# Patient Record
Sex: Female | Born: 2000 | Race: White | Hispanic: Yes | Marital: Single | Smoking: Never smoker
Health system: Southern US, Community
[De-identification: ages and names within clinical notes are randomized; demographics above are authoritative.]

## PROBLEM LIST (undated history)

## (undated) DIAGNOSIS — D649 Anemia, unspecified: Secondary | ICD-10-CM

## (undated) DIAGNOSIS — Z789 Other specified health status: Secondary | ICD-10-CM

## (undated) DIAGNOSIS — G43909 Migraine, unspecified, not intractable, without status migrainosus: Secondary | ICD-10-CM

## (undated) DIAGNOSIS — I889 Nonspecific lymphadenitis, unspecified: Secondary | ICD-10-CM

## (undated) HISTORY — DX: Migraine, unspecified, not intractable, without status migrainosus: G43.909

## (undated) HISTORY — DX: Anemia, unspecified: D64.9

## (undated) HISTORY — PX: NO PAST SURGERIES: SHX2092

---
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
## 2021-01-23 ENCOUNTER — Inpatient Hospital Stay (HOSPITAL_COMMUNITY)
Admission: AD | Admit: 2021-01-23 | Discharge: 2021-01-23 | Disposition: A | Discharge status: Home / Self Care | Payer: Medicaid Other | Attending: Obstetrics and Gynecology | Admitting: Obstetrics and Gynecology

## 2021-01-23 ENCOUNTER — Encounter (HOSPITAL_COMMUNITY): Payer: Self-pay | Admitting: Obstetrics and Gynecology

## 2021-01-23 ENCOUNTER — Inpatient Hospital Stay (HOSPITAL_COMMUNITY): Payer: Medicaid Other

## 2021-01-23 DIAGNOSIS — Z3A01 Less than 8 weeks gestation of pregnancy: Secondary | ICD-10-CM

## 2021-01-23 DIAGNOSIS — O26891 Other specified pregnancy related conditions, first trimester: Secondary | ICD-10-CM | POA: Diagnosis not present

## 2021-01-23 DIAGNOSIS — R109 Unspecified abdominal pain: Secondary | ICD-10-CM | POA: Diagnosis not present

## 2021-01-23 DIAGNOSIS — R1032 Left lower quadrant pain: Secondary | ICD-10-CM | POA: Insufficient documentation

## 2021-01-23 DIAGNOSIS — Z3490 Encounter for supervision of normal pregnancy, unspecified, unspecified trimester: Secondary | ICD-10-CM

## 2021-01-23 DIAGNOSIS — O99891 Other specified diseases and conditions complicating pregnancy: Secondary | ICD-10-CM

## 2021-01-23 HISTORY — DX: Other specified health status: Z78.9

## 2021-01-23 LAB — COMPREHENSIVE METABOLIC PANEL
ALT: 12 U/L (ref 0–44)
AST: 13 U/L — ABNORMAL LOW (ref 15–41)
Albumin: 3.6 g/dL (ref 3.5–5.0)
Alkaline Phosphatase: 47 U/L (ref 38–126)
Anion gap: 8 (ref 5–15)
BUN: 16 mg/dL (ref 6–20)
CO2: 21 mmol/L — ABNORMAL LOW (ref 22–32)
Calcium: 9 mg/dL (ref 8.9–10.3)
Chloride: 106 mmol/L (ref 98–111)
Creatinine, Ser: 0.72 mg/dL (ref 0.44–1.00)
GFR, Estimated: >60 mL/min (ref >60)
Glucose, Bld: 87 mg/dL (ref 70–99)
Potassium: 4 mmol/L (ref 3.5–5.1)
Sodium: 135 mmol/L (ref 135–145)
Total Bilirubin: 0.5 mg/dL (ref 0.3–1.2)
Total Protein: 6.6 g/dL (ref 6.5–8.1)

## 2021-01-23 LAB — CBC
HCT: 34.1 % — ABNORMAL LOW (ref 36.0–46.0)
Hemoglobin: 11.5 g/dL — ABNORMAL LOW (ref 12.0–15.0)
MCH: 27.5 pg (ref 26.0–34.0)
MCHC: 33.7 g/dL (ref 30.0–36.0)
MCV: 81.6 fL (ref 80.0–100.0)
Platelets: 222 10*3/uL (ref 150–400)
RBC: 4.18 MIL/uL (ref 3.87–5.11)
RDW: 13.7 % (ref 11.5–15.5)
WBC: 6.1 10*3/uL (ref 4.0–10.5)
nRBC: 0 % (ref 0.0–0.2)

## 2021-01-23 LAB — URINALYSIS, ROUTINE W REFLEX MICROSCOPIC
Bilirubin Urine: NEGATIVE
Glucose, UA: NEGATIVE mg/dL
Hgb urine dipstick: NEGATIVE
Ketones, ur: NEGATIVE mg/dL
Leukocytes,Ua: NEGATIVE
Nitrite: NEGATIVE
Protein, ur: NEGATIVE mg/dL
Specific Gravity, Urine: 1.02 (ref 1.005–1.030)
pH: 6 (ref 5.0–8.0)

## 2021-01-23 LAB — WET PREP, GENITAL
Clue Cells Wet Prep HPF POC: NONE SEEN
Trich, Wet Prep: NONE SEEN
Yeast Wet Prep HPF POC: NONE SEEN

## 2021-01-23 LAB — POCT PREGNANCY, URINE: Preg Test, Ur: POSITIVE — AB

## 2021-01-23 LAB — ABO/RH: ABO/RH(D): O POS

## 2021-01-23 LAB — HCG, QUANTITATIVE, PREGNANCY: hCG, Beta Chain, Quant, S: 85267 m[IU]/mL — ABNORMAL HIGH (ref <5)

## 2021-01-23 MED ORDER — ONDANSETRON 8 MG PO TBDP: 8.0000 mg | ORAL_TABLET | Freq: Three times a day (TID) | ORAL | 1 refills | Status: DC | PRN

## 2021-01-23 MED ORDER — FERROUS SULFATE 325 (65 FE) MG PO TABS: 325.0000 mg | ORAL_TABLET | ORAL | 1 refills | Status: DC

## 2021-01-23 NOTE — MAU Provider Note (Signed)
Patient Theresa Lane is a 20 y.o. G1P0  At [redacted]w[redacted]d here with complaints of abdominal pain that is ongoing since Sunday (two days ago).  She also reports that she had some bleeding on Sunday as well (two days ago). She reports some nausea, threw up this morning. She denies diarrhea, constipation, fever, chest pain, SOB. She has been told before that she is anemic.  She denies dysuria, abnormal discharge. She had intercourse this morning.  History     CSN: [id number]  Arrival date and time: 01/23/21 1308   None     Chief Complaint  Patient presents with   Abdominal Pain   Abdominal Pain This is a new problem. The current episode started in the past 7 days. The pain is located in the LLQ and suprapubic region. The pain is at a severity of 6/10. The quality of the pain is described as sharp and aching. The pain does not radiate. Associated symptoms include constipation, nausea and vomiting. Pertinent negatives include no diarrhea, dysuria or fever. Nothing relieves the symptoms.  Vaginal Bleeding The patient's primary symptoms include vaginal bleeding. The patient's pertinent negatives include no vaginal discharge. The current episode started in the past 7 days. The problem has been resolved. Associated symptoms include abdominal pain, constipation, nausea and vomiting. Pertinent negatives include no diarrhea, dysuria or fever. The vaginal discharge was bloody. The vaginal bleeding is spotting. Passing clots: small clots.   OB History     Gravida  1   Para      Term      Preterm      AB      Living         SAB      IAB      Ectopic      Multiple      Live Births              Past Medical History:  Diagnosis Date   Medical history non-contributory     Past Surgical History:  Procedure Laterality Date   NO PAST SURGERIES      Family History  Problem Relation Age of Onset   Asthma Mother    Healthy Father     Social History   Tobacco Use   Smoking  status: Never   Smokeless tobacco: Never  Substance Use Topics   Alcohol use: Never   Drug use: Never    Allergies: No Known Allergies  Medications Prior to Admission  Medication Sig Dispense Refill Last Dose   Prenatal Vit-Fe Fumarate-FA (PRENATAL MULTIVITAMIN) TABS tablet Take 1 tablet by mouth daily at 12 noon.   01/23/2021   acetaminophen (TYLENOL) 500 MG tablet Take 500 mg by mouth daily as needed for mild pain or fever.      cyclobenzaprine (FLEXERIL) 5 MG tablet Take 1 tablet (5 mg total) by mouth 2 (two) times daily as needed for muscle spasms. 20 tablet 0    ibuprofen (ADVIL,MOTRIN) 400 MG tablet Take 1 tablet (400 mg total) by mouth every 6 (six) hours as needed. (Patient not taking: Reported on 02/16/2018) 30 tablet 0    meloxicam (MOBIC) 7.5 MG tablet Take 1 tablet (7.5 mg total) by mouth daily. 15 tablet 0     Review of Systems  Constitutional:  Negative for fever.  HENT: Negative.    Respiratory: Negative.    Gastrointestinal:  Positive for abdominal pain, constipation, nausea and vomiting. Negative for diarrhea.  Genitourinary:  Positive for vaginal bleeding. Negative for dysuria  and vaginal discharge.  Skin: Negative.   Neurological: Negative.   Psychiatric/Behavioral: Negative.    Physical Exam   Blood pressure 119/60, pulse 79, temperature 98.6 F (37 C), temperature source Oral, resp. rate 15, height 4\' 10"  (1.473 m), weight 50.3 kg, last menstrual period 12/04/2020, SpO2 99 %.  Physical Exam Constitutional:      Appearance: She is well-developed.  HENT:     Head: Normocephalic.  Cardiovascular:     Rate and Rhythm: Normal rate.  Abdominal:     General: Abdomen is flat. Bowel sounds are normal.     Palpations: Abdomen is soft.     Tenderness: There is abdominal tenderness in the left lower quadrant.  Genitourinary:    Vagina: Normal.     Cervix: Normal.     Uterus: Normal.      Adnexa:        Right: No mass or tenderness.         Left: No mass or  tenderness.       Comments: NEFG; no blood or discharge in the vagina, cervix is pink, no lesions on cervix or vaginal walls; no CMT, cervix is long, closed thick Skin:    General: Skin is warm.  Neurological:     General: No focal deficit present.     Mental Status: She is alert.  Psychiatric:        Mood and Affect: Mood normal.        Behavior: Behavior normal.    MAU Course  Procedures  MDM -due to patient's complaint, a full ectopic work-up was performed.  -SIUP at 7 weeks 1 day visible on 12/06/2020 with cardiac activity, no SCH noted; I have independently reviewed the [place] images, which reveal finding of SIUP -patient UA, CBC and CMP are unremarkable -patient wet prep unremarkable -blood type O pos   Assessment and Plan   1. Abdominal pain   2. Intrauterine pregnancy   -gc ct cultures pending -keep appt at [facility] for prenatal care on 11/29 -return to MAU if any bleeding, abdominal pain, or other concerns; spotting after intercourse is common -RX given for zofran and Iron, also discussed diet, exercise, importance of eating small meals and sipping liquid -discussed constipation prevention measures with Zofran use 12/29 Lanisa Ishler 01/23/2021, 2:23 PM

## 2021-01-23 NOTE — MAU Note (Signed)
On Sunday had some bleeding(small clots) and had some pain in lower abd, in the middle.  The bleeding has stopped, still having pain.  Has been feeling dizzy.  2 +HPT in Oct.

## 2021-01-24 LAB — GC/CHLAMYDIA PROBE AMP (~~LOC~~) NOT AT ARMC
Chlamydia: NEGATIVE
Comment: NEGATIVE
Comment: NORMAL
Neisseria Gonorrhea: NEGATIVE

## 2021-01-29 ENCOUNTER — Inpatient Hospital Stay (HOSPITAL_COMMUNITY)
Admission: AD | Admit: 2021-01-29 | Discharge: 2021-01-29 | Disposition: A | Discharge status: Home / Self Care | Payer: Medicaid Other | Attending: Family Medicine | Admitting: Family Medicine

## 2021-01-29 ENCOUNTER — Inpatient Hospital Stay (EMERGENCY_DEPARTMENT_HOSPITAL)
Admission: AD | Admit: 2021-01-29 | Discharge: 2021-01-30 | Disposition: A | Discharge status: Home / Self Care | Payer: Medicaid Other | Source: Home / Self Care | Attending: Obstetrics and Gynecology | Admitting: Obstetrics and Gynecology

## 2021-01-29 DIAGNOSIS — O209 Hemorrhage in early pregnancy, unspecified: Secondary | ICD-10-CM | POA: Insufficient documentation

## 2021-01-29 DIAGNOSIS — O26891 Other specified pregnancy related conditions, first trimester: Secondary | ICD-10-CM | POA: Insufficient documentation

## 2021-01-29 DIAGNOSIS — Z3491 Encounter for supervision of normal pregnancy, unspecified, first trimester: Secondary | ICD-10-CM

## 2021-01-29 DIAGNOSIS — Z3A08 8 weeks gestation of pregnancy: Secondary | ICD-10-CM | POA: Insufficient documentation

## 2021-01-29 DIAGNOSIS — R102 Pelvic and perineal pain: Secondary | ICD-10-CM | POA: Diagnosis not present

## 2021-01-29 LAB — CBC
HCT: 32.7 % — ABNORMAL LOW (ref 36.0–46.0)
Hemoglobin: 10.7 g/dL — ABNORMAL LOW (ref 12.0–15.0)
MCH: 27.4 pg (ref 26.0–34.0)
MCHC: 32.7 g/dL (ref 30.0–36.0)
MCV: 83.6 fL (ref 80.0–100.0)
Platelets: 208 10*3/uL (ref 150–400)
RBC: 3.91 MIL/uL (ref 3.87–5.11)
RDW: 14.1 % (ref 11.5–15.5)
WBC: 4.6 10*3/uL (ref 4.0–10.5)
nRBC: 0 % (ref 0.0–0.2)

## 2021-01-29 LAB — WET PREP, GENITAL
Clue Cells Wet Prep HPF POC: NONE SEEN
Sperm: NONE SEEN
Trich, Wet Prep: NONE SEEN
Yeast Wet Prep HPF POC: NONE SEEN

## 2021-01-29 LAB — HCG, QUANTITATIVE, PREGNANCY: hCG, Beta Chain, Quant, S: 146215 m[IU]/mL — ABNORMAL HIGH (ref <5)

## 2021-01-29 LAB — HIV ANTIBODY (ROUTINE TESTING W REFLEX): HIV Screen 4th Generation wRfx: NONREACTIVE

## 2021-01-29 NOTE — MAU Note (Signed)
Started bleeding yesterday, "like on the 4th day of a period, trying to end". Not wearing a pad, sees mainly when she wipes, pink, no clots. Little cramping.

## 2021-01-29 NOTE — MAU Provider Note (Signed)
History     CSN: [id number]  Arrival date and time: 01/29/21 1740   Event Date/Time   First Provider Initiated Contact with Patient 01/29/21 [phone]       Chief Complaint  Patient presents with   Vaginal Bleeding   Abdominal Pain   HPI This is a 20 year old G1 P0 at 8 weeks and 0 days by LMP who presents with light vaginal bleeding that started last night.  She has had some staining in her underwear and spotting when she wipes.  She does have some abdominal cramping in her lower pelvis.  No palliating or provoking factors.  OB History     Gravida  1   Para      Term      Preterm      AB      Living         SAB      IAB      Ectopic      Multiple      Live Births              Past Medical History:  Diagnosis Date   Medical history non-contributory     Past Surgical History:  Procedure Laterality Date   NO PAST SURGERIES      Family History  Problem Relation Age of Onset   Asthma Mother    Healthy Father     Social History   Tobacco Use   Smoking status: Never   Smokeless tobacco: Never  Substance Use Topics   Alcohol use: Never   Drug use: Never    Allergies: No Known Allergies  Medications Prior to Admission  Medication Sig Dispense Refill Last Dose   acetaminophen (TYLENOL) 500 MG tablet Take 500 mg by mouth daily as needed for mild pain or fever.      ferrous sulfate 325 (65 FE) MG tablet Take 1 tablet (325 mg total) by mouth every other day. 30 tablet 1    ondansetron (ZOFRAN ODT) 8 MG disintegrating tablet Take 1 tablet (8 mg total) by mouth every 8 (eight) hours as needed for nausea or vomiting. 60 tablet 1    Prenatal Vit-Fe Fumarate-FA (PRENATAL MULTIVITAMIN) TABS tablet Take 1 tablet by mouth daily at 12 noon.       Review of Systems Physical Exam   Blood pressure (!) 119/54, pulse 74, temperature 98.2 F (36.8 C), temperature source Oral, resp. rate 16, height 4\' 10"  (1.473 m), weight 51.9 kg, last menstrual period  12/04/2020, SpO2 100 %.  Physical Exam Vitals reviewed.  Constitutional:      Appearance: She is well-developed.  HENT:     Head: Normocephalic and atraumatic.  Cardiovascular:     Rate and Rhythm: Normal rate and regular rhythm.     Heart sounds: Normal heart sounds.  Pulmonary:     Effort: Pulmonary effort is normal.     Breath sounds: Normal breath sounds.  Abdominal:     General: Abdomen is flat. There is no distension.     Palpations: Abdomen is soft.     Tenderness: There is abdominal tenderness in the suprapubic area. There is no right CVA tenderness, left CVA tenderness, guarding or rebound.  Skin:    General: Skin is warm and dry.     Capillary Refill: Capillary refill takes less than 2 seconds.  Neurological:     General: No focal deficit present.     Mental Status: She is alert.   Results for orders placed  or performed during the hospital encounter of 01/29/21 (from the past 24 hour(s))  CBC     Status: Abnormal   Collection Time: 01/29/21 10:00 AM  Result Value Ref Range   WBC 4.6 4.0 - 10.5 K/uL   RBC 3.91 3.87 - 5.11 MIL/uL   Hemoglobin 10.7 (L) 12.0 - 15.0 g/dL   HCT 42.6 (L) 83.4 - 19.6 %   MCV 83.6 80.0 - 100.0 fL   MCH 27.4 26.0 - 34.0 pg   MCHC 32.7 30.0 - 36.0 g/dL   RDW 22.2 97.9 - 89.2 %   Platelets 208 150 - 400 K/uL   nRBC 0.0 0.0 - 0.2 %  hCG, quantitative, pregnancy     Status: Abnormal   Collection Time: 01/29/21 10:00 AM  Result Value Ref Range   hCG, Beta Chain, Quant, S 146,215 (H) <5 mIU/mL  HIV Antibody (routine testing w rflx)     Status: None   Collection Time: 01/29/21 10:00 AM  Result Value Ref Range   HIV Screen 4th Generation wRfx Non Reactive Non Reactive  Wet prep, genital     Status: Abnormal   Collection Time: 01/29/21 10:12 AM   Specimen: Cervical/Vaginal swab; Genital  Result Value Ref Range   Yeast Wet Prep HPF POC NONE SEEN NONE SEEN   Trich, Wet Prep NONE SEEN NONE SEEN   Clue Cells Wet Prep HPF POC NONE SEEN NONE  SEEN   WBC, Wet Prep HPF POC MANY (A) NONE SEEN   Sperm NONE SEEN      MAU Course  Procedures Pt informed that the ultrasound is considered a limited OB ultrasound and is not intended to be a complete ultrasound exam.  Patient also informed that the ultrasound is not being completed with the intent of assessing for fetal or placental anomalies or any pelvic abnormalities.  Explained that the purpose of today's ultrasound is to assess for  viability.  Patient acknowledges the purpose of the exam and the limitations of the study.    IUP visualized with FHR of 148.   MDM   Assessment and Plan   1. [redacted] weeks gestation of pregnancy   2. Vaginal bleeding in pregnancy, first trimester    Patient reassured. Discharge to home. F/u with [facility].  Levie Heritage 01/29/2021, 9:51 AM

## 2021-01-29 NOTE — MAU Note (Signed)
Was seen earlier today for VB and had u/s and told baby is ok. Tonight passed a clot when she went to [facility] came back to MAU.  States she thinks bleeding has stopped now. Has occ cramping in lower abd.

## 2021-01-30 ENCOUNTER — Inpatient Hospital Stay (HOSPITAL_COMMUNITY): Payer: Medicaid Other

## 2021-01-30 DIAGNOSIS — Z3491 Encounter for supervision of normal pregnancy, unspecified, first trimester: Secondary | ICD-10-CM

## 2021-01-30 DIAGNOSIS — O209 Hemorrhage in early pregnancy, unspecified: Secondary | ICD-10-CM

## 2021-01-30 LAB — URINALYSIS, ROUTINE W REFLEX MICROSCOPIC
Bilirubin Urine: NEGATIVE
Glucose, UA: NEGATIVE mg/dL
Hgb urine dipstick: NEGATIVE
Ketones, ur: 20 mg/dL — AB
Leukocytes,Ua: NEGATIVE
Nitrite: NEGATIVE
Protein, ur: NEGATIVE mg/dL
Specific Gravity, Urine: 1.01 (ref 1.005–1.030)
pH: 5 (ref 5.0–8.0)

## 2021-01-30 LAB — GC/CHLAMYDIA PROBE AMP (~~LOC~~) NOT AT ARMC
Chlamydia: NEGATIVE
Comment: NEGATIVE
Comment: NORMAL
Neisseria Gonorrhea: NEGATIVE

## 2021-01-30 NOTE — MAU Provider Note (Signed)
Event Date/Time   First Provider Initiated Contact with Patient 01/30/21 0103     S [name] is a 20 y.o. G1P0 pregnant female at [redacted]w[redacted]d (with a confirmed IUP) who presents to MAU today with complaint of increased vaginal bleeding and an episode of dizziness after she passed a small clot earlier this evening. She was seen in MAU earlier today for spotting and cleared by Dr. Adrian Lane who visualized fetal heart tones via bedside ultrasound. Had no additional bleeding until this evening when she went to the bathroom, noted a half-dollar sized clot and became dizzy. No bleeding since. No lower abdominal cramping, but has had some upper abdominal pain off and on similar to the pains she had prior to pregnancy with "stomach problems." No pain now. No other physical complaints.  Has not established OB care yet.  Pertinent items noted in HPI and remainder of comprehensive ROS otherwise negative.   O BP 114/68 (BP Location: Right Arm)   Pulse 71   Resp 16   Ht 4\' 10"  (1.473 m)   Wt 113 lb (51.3 kg)   LMP 12/04/2020 Comment: irreg cycles  SpO2 100%   BMI 23.62 kg/m  Physical Exam Vitals and nursing note reviewed.  Constitutional:      Appearance: Normal appearance.  HENT:     Head: Normocephalic.  Eyes:     Pupils: Pupils are equal, round, and reactive to light.  Cardiovascular:     Rate and Rhythm: Normal rate.  Pulmonary:     Effort: Pulmonary effort is normal.  Abdominal:     Palpations: Abdomen is soft.     Tenderness: There is no abdominal tenderness.  Musculoskeletal:        General: Normal range of motion.     Cervical back: Normal range of motion.  Skin:    General: Skin is warm.     Capillary Refill: Capillary refill takes less than 2 seconds.  Neurological:     Mental Status: She is alert and oriented to person, place, and time.  Psychiatric:        Mood and Affect: Mood normal.        Behavior: Behavior normal.        Thought Content: Thought content  normal.        Judgment: Judgment normal.   Pt managed from triage/waiting area due to MAU acuity. Sent to u/s to assess for bleeding, none found. FHT noted to be slower than earlier today (140s per FOB). Pt informed of results and given photo. Bleeding precautions discussed with patient, advised to begin OB care and follow up with MD about "stomach problems" if they persist. Reviewed reasons to return to MAU.   12/06/2020 OB Comp Less 14 Wks  Result Date: 01/30/2021 CLINICAL DATA:  Vaginal bleeding. LMP: 12/05/2018 corresponding to an estimated gestational age of [redacted] weeks, 1 day. The estimated gestational age based on first ultrasound is also 8 weeks, 1 day. EXAM: OBSTETRIC <14 WK ULTRASOUND TECHNIQUE: Transabdominal ultrasound was performed for evaluation of the gestation as well as the maternal uterus and adnexal regions. COMPARISON:  Ultrasound dated 01/23/2021. FINDINGS: Intrauterine gestational sac: Single intrauterine gestational sac. Yolk sac:  Seen Embryo:  Present Cardiac Activity: Detected Heart Rate: 115 bpm CRL:   16 mm   8 w 0 d                  13/10/2020 EDC: 09/11/2021. Subchorionic hemorrhage:  None visualized. Maternal uterus/adnexae: The ovaries are unremarkable. IMPRESSION:  Single live intrauterine pregnancy with an estimated gestational age of [redacted] weeks, 0 days concordant with age based on LMP and first ultrasound. Electronically Signed   By: Elgie Collard M.D.   On: 01/30/2021 00:42    A Vaginal bleeding in first trimester Fetal heart tones present  P Discharge from MAU in stable condition with return precautions Encouraged to begin routine prenatal care as soon as possible. Warning signs for worsening condition that would warrant emergency follow-up discussed  Bernerd Limbo, CNM 01/30/2021 2:20 AM

## 2021-01-30 NOTE — MAU Note (Signed)
Edd Arbour CNM in earlier to discuss u/s results and d/c plan. Written and verbal dc instructions given and understanding voiced. Pt then d/c home by provider

## 2021-03-07 DIAGNOSIS — Z3400 Encounter for supervision of normal first pregnancy, unspecified trimester: Secondary | ICD-10-CM | POA: Insufficient documentation

## 2021-03-08 ENCOUNTER — Encounter: Payer: Self-pay | Admitting: Women's Health

## 2021-03-08 ENCOUNTER — Ambulatory Visit: Payer: Medicaid Other | Admitting: *Deleted

## 2021-03-08 ENCOUNTER — Other Ambulatory Visit: Payer: Self-pay

## 2021-03-08 ENCOUNTER — Ambulatory Visit (INDEPENDENT_AMBULATORY_CARE_PROVIDER_SITE_OTHER): Payer: Medicaid Other | Admitting: Women's Health

## 2021-03-08 VITALS — BP 105/65 | HR 82 | Wt 117.4 lb

## 2021-03-08 DIAGNOSIS — Z3A13 13 weeks gestation of pregnancy: Secondary | ICD-10-CM

## 2021-03-08 DIAGNOSIS — Z3401 Encounter for supervision of normal first pregnancy, first trimester: Secondary | ICD-10-CM | POA: Diagnosis not present

## 2021-03-08 LAB — POCT URINALYSIS DIPSTICK OB
Blood, UA: NEGATIVE
Glucose, UA: NEGATIVE
Ketones, UA: NEGATIVE
Leukocytes, UA: NEGATIVE
Nitrite, UA: NEGATIVE
POC,PROTEIN,UA: NEGATIVE

## 2021-03-08 MED ORDER — DOXYLAMINE-PYRIDOXINE 10-10 MG PO TBEC
DELAYED_RELEASE_TABLET | ORAL | 6 refills | Status: DC
Start: 2021-03-08 — End: 2021-09-13

## 2021-03-08 MED ORDER — BLOOD PRESSURE MONITOR MISC
0 refills | Status: DC
Start: 2021-03-08 — End: 2021-11-26

## 2021-03-08 NOTE — Progress Notes (Signed)
INITIAL OBSTETRICAL VISIT Patient name: Theresa Lane MRN [id number]  Date of birth: 11-02-2000 Chief Complaint:   Initial Prenatal Visit  History of Present Illness:   Theresa Lane is a 20 y.o. G1P0 Hispanic female at [redacted]w[redacted]d by LMP c/w u/s at 7 weeks with an Estimated Date of Delivery: 09/10/21 being seen today for her initial obstetrical visit.   Patient's last menstrual period was 12/04/2020. Her obstetrical history is significant for primigravida.   Today she reports N/V, requests meds Last pap <21yo. Results were: N/A  Depression screen [facility] 2/9 03/08/2021  Decreased Interest 1  Down, Depressed, Hopeless 0  PHQ - 2 Score 1  Altered sleeping 2  Tired, decreased energy 2  Change in appetite 2  Feeling bad or failure about yourself  0  Trouble concentrating 1  Moving slowly or fidgety/restless 1  Suicidal thoughts 0  PHQ-9 Score 9     GAD 7 : Generalized Anxiety Score 03/08/2021  Nervous, Anxious, on Edge 1  Control/stop worrying 0  Worry too much - different things 1  Trouble relaxing 1  Restless 1  Easily annoyed or irritable 0  Afraid - awful might happen 1  Total GAD 7 Score 5     Review of Systems:   Pertinent items are noted in HPI Denies cramping/contractions, leakage of fluid, vaginal bleeding, abnormal vaginal discharge w/ itching/odor/irritation, headaches, visual changes, shortness of breath, chest pain, abdominal pain, severe nausea/vomiting, or problems with urination or bowel movements unless otherwise stated above.  Pertinent History Reviewed:  Reviewed past medical,surgical, social, obstetrical and family history.  Reviewed problem list, medications and allergies. OB History  Gravida Para Term Preterm AB Living  1            SAB IAB Ectopic Multiple Live Births               # Outcome Date GA Lbr Len/2nd Weight Sex Delivery Anes PTL Lv  1 Current            Physical Assessment:   Vitals:   03/08/21 1026  BP: 105/65  Pulse: 82   Weight: 117 lb 6.4 oz (53.3 kg)  Body mass index is 24.54 kg/m.       Physical Examination:  General appearance - well appearing, and in no distress  Mental status - alert, oriented to person, place, and time  Psych:  She has a normal mood and affect  Skin - warm and dry, normal color, no suspicious lesions noted  Chest - effort normal, all lung fields clear to auscultation bilaterally  Heart - normal rate and regular rhythm  Abdomen - soft, nontender  Extremities:  No swelling or varicosities noted  Thin prep pap is not done   Chaperone: N/A    TODAY'S FHR: 153 via doppler  Results for orders placed or performed in visit on 03/08/21 (from the past 24 hour(s))  POC Urinalysis Dipstick OB   Collection Time: 03/08/21 10:21 AM  Result Value Ref Range   Color, UA     Clarity, UA     Glucose, UA Negative Negative   Bilirubin, UA     Ketones, UA neg    Spec Grav, UA     Blood, UA neg    pH, UA     POC,PROTEIN,UA Negative Negative, Trace, Small (1+), Moderate (2+), Large (3+), 4+   Urobilinogen, UA     Nitrite, UA neg    Leukocytes, UA Negative Negative   Appearance  Odor      Assessment & Plan:  1) Low-Risk Pregnancy G1P0 at [redacted]w[redacted]d with an Estimated Date of Delivery: 09/10/21   2) Initial OB visit  3) N/V> rx diclegis  Meds:  Meds ordered this encounter  Medications   Blood Pressure Monitor MISC    Sig: For regular home bp monitoring during pregnancy    Dispense:  1 each    Refill:  0    Please mail to patient    Initial labs obtained Continue prenatal vitamins Reviewed n/v relief measures and warning s/s to report Reviewed recommended weight gain based on pre-gravid BMI Encouraged well-balanced diet Genetic & carrier screening discussed: requests Panorama, AFP, and Horizon  Ultrasound discussed; fetal survey: requested CCNC completed> form faxed if has or is planning to apply for medicaid The nature of [employer] for [employer] with  multiple MDs and other Advanced Practice Providers was explained to patient; also emphasized that fellows, residents, and students are part of our team. Does not have home bp cuff. Office bp cuff given: no. Rx sent: yes. Check bp weekly, let us know if consistently >140/90.    Follow-up: Return in about 3 weeks (around 03/29/2021) for LROB, AFP, CNM, in person.   Orders Placed This Encounter  Procedures   GC/Chlamydia Probe Amp   Urine Culture   CBC/D/Plt+RPR+Rh+ABO+RubIgG...   Pain Management Screening Profile (10S)   POC Urinalysis Dipstick OB    Cheral Marker CNM, [facility] 03/08/2021 10:59 AM

## 2021-03-08 NOTE — Patient Instructions (Signed)
Theresa Lane, thank you for choosing our office today! We appreciate the opportunity to meet your healthcare needs. You may receive a short survey by mail, e-mail, or through [employer]. If you are happy with your care we would appreciate if you could take just a few minutes to complete the survey questions. We read all of your comments and take your feedback very seriously. Thank you again for choosing our office.  [facility] for [employer] [facility] at [name]  [name] at [name] ([address] [place], [place] [address]) Entrance C, located off of [address] Free 24/7 valet parking   Nausea & Vomiting Have saltine crackers or pretzels by your bed and eat a few bites before you raise your head out of bed in the morning Eat small frequent meals throughout the day instead of large meals Drink plenty of fluids throughout the day to stay hydrated, just don't drink a lot of fluids with your meals.  This can make your stomach fill up faster making you feel sick Do not brush your teeth right after you eat Products with real ginger are good for nausea, like ginger ale and ginger hard candy Make sure it says made with real ginger! Sucking on sour candy like lemon heads is also good for nausea If your prenatal vitamins make you nauseated, take them at night so you will sleep through the nausea Sea Bands If you feel like you need medicine for the nausea & vomiting please let us know If you are unable to keep any fluids or food down please let us know   Constipation Drink plenty of fluid, preferably water, throughout the day Eat foods high in fiber such as fruits, vegetables, and grains Exercise, such as walking, is a good way to keep your bowels regular Drink warm fluids, especially warm prune juice, or decaf coffee Eat a 1/2 cup of real oatmeal (not instant), 1/2 cup applesauce, and 1/2-1 cup warm prune juice every day If needed, you may take Colace (docusate sodium) stool softener  once or twice a day to help keep the stool soft.  If you still are having problems with constipation, you may take Miralax once daily as needed to help keep your bowels regular.   Home Blood Pressure Monitoring for Patients   Your provider has recommended that you check your blood pressure (BP) at least once a week at home. If you do not have a blood pressure cuff at home, one will be provided for you. Contact your provider if you have not received your monitor within 1 week.   Helpful Tips for Accurate Home Blood Pressure Checks  Don't smoke, exercise, or drink caffeine 30 minutes before checking your BP Use the restroom before checking your BP (a full bladder can raise your pressure) Relax in a comfortable upright chair Feet on the ground Left arm resting comfortably on a flat surface at the level of your heart Legs uncrossed Back supported Sit quietly and don't talk Place the cuff on your bare arm Adjust snuggly, so that only two fingertips can fit between your skin and the top of the cuff Check 2 readings separated by at least one minute Keep a log of your BP readings For a visual, please reference this diagram: http://ccnc.care/bpdiagram  Provider Name: [facility]     Phone: [phone]  Zone 1: ALL CLEAR  Continue to monitor your symptoms:  BP reading is less than 140 (top number) or less than 90 (bottom  number)  No right upper stomach pain No headaches or seeing spots No feeling nauseated or throwing up No swelling in face and hands  Zone 2: CAUTION Call your doctor's office for any of the following:  BP reading is greater than 140 (top number) or greater than 90 (bottom number)  Stomach pain under your ribs in the middle or right side Headaches or seeing spots Feeling nauseated or throwing up Swelling in face and hands  Zone 3: EMERGENCY  Seek immediate medical care if you have any of the following:  BP reading is greater than160 (top number) or greater than  110 (bottom number) Severe headaches not improving with Tylenol Serious difficulty catching your breath Any worsening symptoms from Zone 2    First Trimester of Pregnancy The first trimester of pregnancy is from week 1 until the end of week 12 (months 1 through 3). A week after a sperm fertilizes an egg, the egg will implant on the wall of the uterus. This embryo will begin to develop into a baby. Genes from you and your partner are forming the baby. The female genes determine whether the baby is a boy or a girl. At 6-8 weeks, the eyes and face are formed, and the heartbeat can be seen on ultrasound. At the end of 12 weeks, all the baby's organs are formed.  Now that you are pregnant, you will want to do everything you can to have a healthy baby. Two of the most important things are to get good prenatal care and to follow your health care provider's instructions. Prenatal care is all the medical care you receive before the baby's birth. This care will help prevent, find, and treat any problems during the pregnancy and childbirth. BODY CHANGES Your body goes through many changes during pregnancy. The changes vary from woman to woman.  You may gain or lose a couple of pounds at first. You may feel sick to your stomach (nauseous) and throw up (vomit). If the vomiting is uncontrollable, call your health care provider. You may tire easily. You may develop headaches that can be relieved by medicines approved by your health care provider. You may urinate more often. Painful urination may mean you have a bladder infection. You may develop heartburn as a result of your pregnancy. You may develop constipation because certain hormones are causing the muscles that push waste through your intestines to slow down. You may develop hemorrhoids or swollen, bulging veins (varicose veins). Your breasts may begin to grow larger and become tender. Your nipples may stick out more, and the tissue that surrounds them  (areola) may become darker. Your gums may bleed and may be sensitive to brushing and flossing. Dark spots or blotches (chloasma, mask of pregnancy) may develop on your face. This will likely fade after the baby is born. Your menstrual periods will stop. You may have a loss of appetite. You may develop cravings for certain kinds of food. You may have changes in your emotions from day to day, such as being excited to be pregnant or being concerned that something may go wrong with the pregnancy and baby. You may have more vivid and strange dreams. You may have changes in your hair. These can include thickening of your hair, rapid growth, and changes in texture. Some women also have hair loss during or after pregnancy, or hair that feels dry or thin. Your hair will most likely return to normal after your baby is born. WHAT TO EXPECT AT YOUR PRENATAL   VISITS During a routine prenatal visit: You will be weighed to make sure you and the baby are growing normally. Your blood pressure will be taken. Your abdomen will be measured to track your baby's growth. The fetal heartbeat will be listened to starting around week 10 or 12 of your pregnancy. Test results from any previous visits will be discussed. Your health care provider may ask you: How you are feeling. If you are feeling the baby move. If you have had any abnormal symptoms, such as leaking fluid, bleeding, severe headaches, or abdominal cramping. If you have any questions. Other tests that may be performed during your first trimester include: Blood tests to find your blood type and to check for the presence of any previous infections. They will also be used to check for low iron levels (anemia) and Rh antibodies. Later in the pregnancy, blood tests for diabetes will be done along with other tests if problems develop. Urine tests to check for infections, diabetes, or protein in the urine. An ultrasound to confirm the proper growth and development  of the baby. An amniocentesis to check for possible genetic problems. Fetal screens for spina bifida and Down syndrome. You may need other tests to make sure you and the baby are doing well. HOME CARE INSTRUCTIONS  Medicines Follow your health care provider's instructions regarding medicine use. Specific medicines may be either safe or unsafe to take during pregnancy. Take your prenatal vitamins as directed. If you develop constipation, try taking a stool softener if your health care provider approves. Diet Eat regular, well-balanced meals. Choose a variety of foods, such as meat or vegetable-based protein, fish, milk and low-fat dairy products, vegetables, fruits, and whole grain breads and cereals. Your health care provider will help you determine the amount of weight gain that is right for you. Avoid raw meat and uncooked cheese. These carry germs that can cause birth defects in the baby. Eating four or five small meals rather than three large meals a day may help relieve nausea and vomiting. If you start to feel nauseous, eating a few soda crackers can be helpful. Drinking liquids between meals instead of during meals also seems to help nausea and vomiting. If you develop constipation, eat more high-fiber foods, such as fresh vegetables or fruit and whole grains. Drink enough fluids to keep your urine clear or pale yellow. Activity and Exercise Exercise only as directed by your health care provider. Exercising will help you: Control your weight. Stay in shape. Be prepared for labor and delivery. Experiencing pain or cramping in the lower abdomen or low back is a good sign that you should stop exercising. Check with your health care provider before continuing normal exercises. Try to avoid standing for long periods of time. Move your legs often if you must stand in one place for a long time. Avoid heavy lifting. Wear low-heeled shoes, and practice good posture. You may continue to have sex  unless your health care provider directs you otherwise. Relief of Pain or Discomfort Wear a good support bra for breast tenderness.   Take warm sitz baths to soothe any pain or discomfort caused by hemorrhoids. Use hemorrhoid cream if your health care provider approves.   Rest with your legs elevated if you have leg cramps or low back pain. If you develop varicose veins in your legs, wear support hose. Elevate your feet for 15 minutes, 3-4 times a day. Limit salt in your diet. Prenatal Care Schedule your prenatal visits by the   twelfth week of pregnancy. They are usually scheduled monthly at first, then more often in the last 2 months before delivery. Write down your questions. Take them to your prenatal visits. Keep all your prenatal visits as directed by your health care provider. Safety Wear your seat belt at all times when driving. Make a list of emergency phone numbers, including numbers for family, friends, the hospital, and police and fire departments. General Tips Ask your health care provider for a referral to a local prenatal education class. Begin classes no later than at the beginning of month 6 of your pregnancy. Ask for help if you have counseling or nutritional needs during pregnancy. Your health care provider can offer advice or refer you to specialists for help with various needs. Do not use hot tubs, steam rooms, or saunas. Do not douche or use tampons or scented sanitary pads. Do not cross your legs for long periods of time. Avoid cat litter boxes and soil used by cats. These carry germs that can cause birth defects in the baby and possibly loss of the fetus by miscarriage or stillbirth. Avoid all smoking, herbs, alcohol, and medicines not prescribed by your health care provider. Chemicals in these affect the formation and growth of the baby. Schedule a dentist appointment. At home, brush your teeth with a soft toothbrush and be gentle when you floss. SEEK MEDICAL CARE IF:   You have dizziness. You have mild pelvic cramps, pelvic pressure, or nagging pain in the abdominal area. You have persistent nausea, vomiting, or diarrhea. You have a bad smelling vaginal discharge. You have pain with urination. You notice increased swelling in your face, hands, legs, or ankles. SEEK IMMEDIATE MEDICAL CARE IF:  You have a fever. You are leaking fluid from your vagina. You have spotting or bleeding from your vagina. You have severe abdominal cramping or pain. You have rapid weight gain or loss. You vomit blood or material that looks like coffee grounds. You are exposed to German measles and have never had them. You are exposed to fifth disease or chickenpox. You develop a severe headache. You have shortness of breath. You have any kind of trauma, such as from a fall or a car accident. Document Released: 02/26/2001 Document Revised: 07/19/2013 Document Reviewed: 01/12/2013 ExitCare Patient Information 2015 [employer]. This information is not intended to replace advice given to you by your health care provider. Make sure you discuss any questions you have with your health care provider.  

## 2021-03-09 ENCOUNTER — Encounter: Payer: Self-pay | Admitting: Women's Health

## 2021-03-09 DIAGNOSIS — Z2839 Other underimmunization status: Secondary | ICD-10-CM | POA: Insufficient documentation

## 2021-03-09 DIAGNOSIS — O09899 Supervision of other high risk pregnancies, unspecified trimester: Secondary | ICD-10-CM | POA: Insufficient documentation

## 2021-03-09 LAB — CBC/D/PLT+RPR+RH+ABO+RUBIGG...
Antibody Screen: NEGATIVE
Basophils Absolute: 0 10*3/uL (ref 0.0–0.2)
Basos: 0 %
EOS (ABSOLUTE): 0.1 10*3/uL (ref 0.0–0.4)
Eos: 1 %
HCV Ab: <0.1 s/co ratio (ref 0.0–0.9)
HIV Screen 4th Generation wRfx: NONREACTIVE
Hematocrit: 32.3 % — ABNORMAL LOW (ref 34.0–46.6)
Hemoglobin: 10.9 g/dL — ABNORMAL LOW (ref 11.1–15.9)
Hepatitis B Surface Ag: NEGATIVE
Immature Grans (Abs): 0 10*3/uL (ref 0.0–0.1)
Immature Granulocytes: 0 %
Lymphocytes Absolute: 1.5 10*3/uL (ref 0.7–3.1)
Lymphs: 24 %
MCH: 27.9 pg (ref 26.6–33.0)
MCHC: 33.7 g/dL (ref 31.5–35.7)
MCV: 83 fL (ref 79–97)
Monocytes Absolute: 0.3 10*3/uL (ref 0.1–0.9)
Monocytes: 5 %
Neutrophils Absolute: 4.2 10*3/uL (ref 1.4–7.0)
Neutrophils: 70 %
Platelets: 214 10*3/uL (ref 150–450)
RBC: 3.9 x10E6/uL (ref 3.77–5.28)
RDW: 14.4 % (ref 11.7–15.4)
RPR Ser Ql: NONREACTIVE
Rh Factor: POSITIVE
Rubella Antibodies, IGG: <0.9 index — ABNORMAL LOW (ref >0.99)
WBC: 6 10*3/uL (ref 3.4–10.8)

## 2021-03-09 LAB — PMP SCREEN PROFILE (10S), URINE
Amphetamine Scrn, Ur: NEGATIVE ng/mL
BARBITURATE SCREEN URINE: NEGATIVE ng/mL
BENZODIAZEPINE SCREEN, URINE: NEGATIVE ng/mL
CANNABINOIDS UR QL SCN: NEGATIVE ng/mL
Cocaine (Metab) Scrn, Ur: NEGATIVE ng/mL
Creatinine(Crt), U: 116.1 mg/dL (ref 20.0–300.0)
Methadone Screen, Urine: NEGATIVE ng/mL
OXYCODONE+OXYMORPHONE UR QL SCN: NEGATIVE ng/mL
Opiate Scrn, Ur: NEGATIVE ng/mL
Ph of Urine: 5.9 (ref 4.5–8.9)
Phencyclidine Qn, Ur: NEGATIVE ng/mL
Propoxyphene Scrn, Ur: NEGATIVE ng/mL

## 2021-03-09 LAB — HCV INTERPRETATION

## 2021-03-12 LAB — URINE CULTURE: Organism ID, Bacteria: NO GROWTH

## 2021-03-14 LAB — GC/CHLAMYDIA PROBE AMP
Chlamydia trachomatis, NAA: NEGATIVE
Neisseria Gonorrhoeae by PCR: NEGATIVE

## 2021-03-18 NOTE — L&D Delivery Note (Addendum)
OB/GYN Faculty Practice Delivery Note  Theresa Lane is a 21 y.o. G1P1001 at [redacted]w[redacted]d. She was admitted for IOL for DFM.   ROM: 5h 55m with clear fluid GBS Status: negative  Labor Progress: Patient was admitted for IOL for DFM at 1.5/60/-3. She was given cytotec and progressed to 4/60/-2. AROM was performed and patient was started on pitocin. She decided on epidural and progressed to complete.   Delivery Date/Time: 09/11/2021 at 1343 Delivery: Called to room and patient was complete and pushing. Pushed for under 30 minutes. Head delivered direct OA. Tight nuchal cord present which was delivered through as unable to reduce at perineum prior to remainder of delivery. Shoulders and body delivered in usual fashion. Infant with spontaneous cry, placed on mother's abdomen, dried and stimulated. Cord clamped x 2 after 1-minute delay, and cut by FOB under my direct supervision. Cord blood drawn. Placenta delivered spontaneously with gentle cord traction. Fundus firm with massage, lower uterine sweep, and Pitocin. Labia, perineum, vagina, and cervix were inspected with a R periurethral laceration which extended into inferior labia. A I&O urinary catheter was placed prior to repair and easily removed afterwards.    Placenta: 3 vessel cord, intact Complications: None Lacerations: R periurethral laceration which extended into inferior labia. Repaired in usual fashion with 3-0 Monocryl  EBL: 475 Analgesia: epidural  Infant: female Theresa Hua)  APGARs 9/9  pending weight  Theresa Chiquito, MD PGY-2  ATTESTATION  I was present, gloved, and supervising throughout the delivery and agree with above documentation in the resident's note.  Allayne Stack, DO OB Fellow  [facility] for [employer] (Faculty Practice) 09/11/2021, 3:03 PM

## 2021-03-20 ENCOUNTER — Encounter: Payer: Self-pay | Admitting: Women's Health

## 2021-03-27 ENCOUNTER — Encounter: Payer: Self-pay | Admitting: Women's Health

## 2021-03-29 ENCOUNTER — Ambulatory Visit (INDEPENDENT_AMBULATORY_CARE_PROVIDER_SITE_OTHER): Payer: Medicaid Other | Admitting: Advanced Practice Midwife

## 2021-03-29 ENCOUNTER — Other Ambulatory Visit: Payer: Self-pay

## 2021-03-29 VITALS — BP 100/66 | HR 77 | Wt 124.0 lb

## 2021-03-29 DIAGNOSIS — Z3A16 16 weeks gestation of pregnancy: Secondary | ICD-10-CM

## 2021-03-29 DIAGNOSIS — Z1379 Encounter for other screening for genetic and chromosomal anomalies: Secondary | ICD-10-CM

## 2021-03-29 DIAGNOSIS — Z363 Encounter for antenatal screening for malformations: Secondary | ICD-10-CM

## 2021-03-29 DIAGNOSIS — Z3402 Encounter for supervision of normal first pregnancy, second trimester: Secondary | ICD-10-CM

## 2021-03-29 MED ORDER — PNV PRENATAL PLUS MULTIVITAMIN 27-1 MG PO TABS: 1.0000 | ORAL_TABLET | Freq: Every day | ORAL | 4 refills | Status: DC

## 2021-03-29 NOTE — Progress Notes (Signed)
° °  LOW-RISK PREGNANCY VISIT Patient name: Theresa Lane MRN [id number]  Date of birth: [birth year] Chief Complaint:   Routine Prenatal Visit  History of Present Illness:   Theresa Lane is a 21 y.o. G1P0 female at [redacted]w[redacted]d with an Estimated Date of Delivery: 09/10/21 being seen today for ongoing management of a low-risk pregnancy.  Today she reports no complaints. Contractions: Not present. Vag. Bleeding: None.  Movement: Present. denies leaking of fluid. Review of Systems:   Pertinent items are noted in HPI Denies abnormal vaginal discharge w/ itching/odor/irritation, headaches, visual changes, shortness of breath, chest pain, abdominal pain, severe nausea/vomiting, or problems with urination or bowel movements unless otherwise stated above. Pertinent History Reviewed:  Reviewed past medical,surgical, social, obstetrical and family history.  Reviewed problem list, medications and allergies. Physical Assessment:   Vitals:   03/29/21 0847  BP: 100/66  Pulse: 77  Weight: 124 lb (56.2 kg)  Body mass index is 25.92 kg/m.        Physical Examination:   General appearance: Well appearing, and in no distress  Mental status: Alert, oriented to person, place, and time  Skin: Warm & dry  Cardiovascular: Normal heart rate noted  Respiratory: Normal respiratory effort, no distress  Abdomen: Soft, gravid, nontender  Pelvic: Cervical exam deferred         Extremities: Edema: None  Fetal Status:     Movement: Present    Chaperone: n/a    No results found for this or any previous visit (from the past 24 hour(s)).  Assessment & Plan:  1) Low-risk pregnancy G1P0 at [redacted]w[redacted]d with an Estimated Date of Delivery: 09/10/21      Meds: No orders of the defined types were placed in this encounter.  Labs/procedures today: 2nd IT  Plan:  Continue routine obstetrical care  Next visit: prefers will be in person for anatomy scan     Reviewed: Preterm labor symptoms and general obstetric  precautions including but not limited to vaginal bleeding, contractions, leaking of fluid and fetal movement were reviewed in detail with the patient.  All questions were answered. Has received her home bp cuff.. Check bp weekly, let us know if >140/90.   Follow-up: Return in about 2 weeks (around 04/12/2021) for BO:[id number], LROB.  Orders Placed This Encounter  Procedures   US OB Comp + 14 Wk   AFP, Serum, Open Spina Bifida   Jacklyn Shell DNP, CNM 03/29/2021 8:59 AM

## 2021-03-29 NOTE — Patient Instructions (Signed)
[facility], I greatly value your feedback.  If you receive a survey following your visit with [place] today, we appreciate you taking the time to fill it out.  Thanks, Nigel Berthold, CNM     [place]!!! It is now [place] at [facility] ([place], [place] [address]) Entrance located off of [address]   Go to [email or web address] to register for FREE online childbirth classes    Second Trimester of Pregnancy The second trimester is from week 14 through week 27 (months 4 through 6). The second trimester is often a time when you feel your best. Your body has adjusted to being pregnant, and you begin to feel better physically. Usually, morning sickness has lessened or quit completely, you may have more energy, and you may have an increase in appetite. The second trimester is also a time when the fetus is growing rapidly. At the end of the sixth month, the fetus is about 9 inches long and weighs about 1 pounds. You will likely begin to feel the baby move (quickening) between 16 and 20 weeks of pregnancy. Body changes during your second trimester Your body continues to go through many changes during your second trimester. The changes vary from woman to woman. Your weight will continue to increase. You will notice your lower abdomen bulging out. You may begin to get stretch marks on your hips, abdomen, and breasts. You may develop headaches that can be relieved by medicines. The medicines should be approved by your health care provider. You may urinate more often because the fetus is pressing on your bladder. You may develop or continue to have heartburn as a result of your pregnancy. You may develop constipation because certain hormones are causing the muscles that push waste through your intestines to slow down. You may develop hemorrhoids or swollen, bulging veins (varicose veins). You may have back pain. This  is caused by: Weight gain. Pregnancy hormones that are relaxing the joints in your pelvis. A shift in weight and the muscles that support your balance. Your breasts will continue to grow and they will continue to become tender. Your gums may bleed and may be sensitive to brushing and flossing. Dark spots or blotches (chloasma, mask of pregnancy) may develop on your face. This will likely fade after the baby is born. A dark line from your belly button to the pubic area (linea nigra) may appear. This will likely fade after the baby is born. You may have changes in your hair. These can include thickening of your hair, rapid growth, and changes in texture. Some women also have hair loss during or after pregnancy, or hair that feels dry or thin. Your hair will most likely return to normal after your baby is born.  What to expect at prenatal visits During a routine prenatal visit: You will be weighed to make sure you and the fetus are growing normally. Your blood pressure will be taken. Your abdomen will be measured to track your baby's growth. The fetal heartbeat will be listened to. Any test results from the previous visit will be discussed.  Your health care provider may ask you: How you are feeling. If you are feeling the baby move. If you have had any abnormal symptoms, such as leaking fluid, bleeding, severe headaches, or abdominal cramping. If you are using any tobacco products, including cigarettes, chewing tobacco, and electronic cigarettes. If you have any questions.  Other tests that  may be performed during your second trimester include: Blood tests that check for: Low iron levels (anemia). High blood sugar that affects pregnant women (gestational diabetes) between 72 and 28 weeks. Rh antibodies. This is to check for a protein on red blood cells (Rh factor). Urine tests to check for infections, diabetes, or protein in the urine. An ultrasound to confirm the proper growth and  development of the baby. An amniocentesis to check for possible genetic problems. Fetal screens for spina bifida and Down syndrome. HIV (human immunodeficiency virus) testing. Routine prenatal testing includes screening for HIV, unless you choose not to have this test.  Follow these instructions at home: Medicines Follow your health care provider's instructions regarding medicine use. Specific medicines may be either safe or unsafe to take during pregnancy. Take a prenatal vitamin that contains at least 600 micrograms (mcg) of folic acid. If you develop constipation, try taking a stool softener if your health care provider approves. Eating and drinking Eat a balanced diet that includes fresh fruits and vegetables, whole grains, good sources of protein such as meat, eggs, or tofu, and low-fat dairy. Your health care provider will help you determine the amount of weight gain that is right for you. Avoid raw meat and uncooked cheese. These carry germs that can cause birth defects in the baby. If you have low calcium intake from food, talk to your health care provider about whether you should take a daily calcium supplement. Limit foods that are high in fat and processed sugars, such as fried and sweet foods. To prevent constipation: Drink enough fluid to keep your urine clear or pale yellow. Eat foods that are high in fiber, such as fresh fruits and vegetables, whole grains, and beans. Activity Exercise only as directed by your health care provider. Most women can continue their usual exercise routine during pregnancy. Try to exercise for 30 minutes at least 5 days a week. Stop exercising if you experience uterine contractions. Avoid heavy lifting, wear low heel shoes, and practice good posture. A sexual relationship may be continued unless your health care provider directs you otherwise. Relieving pain and discomfort Wear a good support bra to prevent discomfort from breast tenderness. Take  warm sitz baths to soothe any pain or discomfort caused by hemorrhoids. Use hemorrhoid cream if your health care provider approves. Rest with your legs elevated if you have leg cramps or low back pain. If you develop varicose veins, wear support hose. Elevate your feet for 15 minutes, 3-4 times a day. Limit salt in your diet. Prenatal Care Write down your questions. Take them to your prenatal visits. Keep all your prenatal visits as told by your health care provider. This is important. Safety Wear your seat belt at all times when driving. Make a list of emergency phone numbers, including numbers for family, friends, the hospital, and police and fire departments. General instructions Ask your health care provider for a referral to a local prenatal education class. Begin classes no later than the beginning of month 6 of your pregnancy. Ask for help if you have counseling or nutritional needs during pregnancy. Your health care provider can offer advice or refer you to specialists for help with various needs. Do not use hot tubs, steam rooms, or saunas. Do not douche or use tampons or scented sanitary pads. Do not cross your legs for long periods of time. Avoid cat litter boxes and soil used by cats. These carry germs that can cause birth defects in the baby and  possibly loss of the fetus by miscarriage or stillbirth. Avoid all smoking, herbs, alcohol, and unprescribed drugs. Chemicals in these products can affect the formation and growth of the baby. Do not use any products that contain nicotine or tobacco, such as cigarettes and e-cigarettes. If you need help quitting, ask your health care provider. Visit your dentist if you have not gone yet during your pregnancy. Use a soft toothbrush to brush your teeth and be gentle when you floss. Contact a health care provider if: You have dizziness. You have mild pelvic cramps, pelvic pressure, or nagging pain in the abdominal area. You have persistent  nausea, vomiting, or diarrhea. You have a bad smelling vaginal discharge. You have pain when you urinate. Get help right away if: You have a fever. You are leaking fluid from your vagina. You have spotting or bleeding from your vagina. You have severe abdominal cramping or pain. You have rapid weight gain or weight loss. You have shortness of breath with chest pain. You notice sudden or extreme swelling of your face, hands, ankles, feet, or legs. You have not felt your baby move in over an hour. You have severe headaches that do not go away when you take medicine. You have vision changes. Summary The second trimester is from week 14 through week 27 (months 4 through 6). It is also a time when the fetus is growing rapidly. Your body goes through many changes during pregnancy. The changes vary from woman to woman. Avoid all smoking, herbs, alcohol, and unprescribed drugs. These chemicals affect the formation and growth your baby. Do not use any tobacco products, such as cigarettes, chewing tobacco, and e-cigarettes. If you need help quitting, ask your health care provider. Contact your health care provider if you have any questions. Keep all prenatal visits as told by your health care provider. This is important. This information is not intended to replace advice given to you by your health care provider. Make sure you discuss any questions you have with your health care provider.

## 2021-03-31 LAB — AFP, SERUM, OPEN SPINA BIFIDA
AFP MoM: 0.91
AFP Value: 32.6 ng/mL
Gest. Age on Collection Date: 16 weeks
Maternal Age At EDD: 20 yr
OSBR Risk 1 IN: 10000
Test Results:: NEGATIVE
Weight: 145 [lb_av]

## 2021-04-11 ENCOUNTER — Ambulatory Visit (INDEPENDENT_AMBULATORY_CARE_PROVIDER_SITE_OTHER): Payer: Medicaid Other

## 2021-04-11 ENCOUNTER — Ambulatory Visit (INDEPENDENT_AMBULATORY_CARE_PROVIDER_SITE_OTHER): Payer: Medicaid Other | Admitting: Obstetrics & Gynecology

## 2021-04-11 ENCOUNTER — Encounter: Payer: Self-pay | Admitting: Obstetrics & Gynecology

## 2021-04-11 ENCOUNTER — Other Ambulatory Visit: Payer: Self-pay

## 2021-04-11 VITALS — BP 96/62 | HR 70 | Wt 122.2 lb

## 2021-04-11 DIAGNOSIS — Z3A18 18 weeks gestation of pregnancy: Secondary | ICD-10-CM

## 2021-04-11 DIAGNOSIS — Z363 Encounter for antenatal screening for malformations: Secondary | ICD-10-CM

## 2021-04-11 DIAGNOSIS — Z3402 Encounter for supervision of normal first pregnancy, second trimester: Secondary | ICD-10-CM

## 2021-04-11 NOTE — Progress Notes (Signed)
[place] 18+2 wks,cephalic,posterior placenta gr 0,normal ovaries,CX 3.2 cm,SVP of fluid 5 cm,FHR 146 BPM,EFW 262 g 79%,anatomy complete,no obvious abnormalities

## 2021-04-11 NOTE — Progress Notes (Signed)
° °  LOW-RISK PREGNANCY VISIT Patient name: Theresa Lane MRN [id number]  Date of birth: 2000-06-18 Chief Complaint:   Routine Prenatal Visit ([place] today; feels dizzy @ times and heart beats fast)  History of Present Illness:   Theresa Lane is a 21 y.o. G1P0 female at [redacted]w[redacted]d with an Estimated Date of Delivery: 09/10/21 being seen today for ongoing management of a low-risk pregnancy.   Depression screen [facility] 2/9 03/08/2021  Decreased Interest 1  Down, Depressed, Hopeless 0  PHQ - 2 Score 1  Altered sleeping 2  Tired, decreased energy 2  Change in appetite 2  Feeling bad or failure about yourself  0  Trouble concentrating 1  Moving slowly or fidgety/restless 1  Suicidal thoughts 0  PHQ-9 Score 9    Today she reports no complaints. Contractions: Not present. Vag. Bleeding: None.  Movement: Present. denies leaking of fluid. Review of Systems:   Pertinent items are noted in HPI Denies abnormal vaginal discharge w/ itching/odor/irritation, headaches, visual changes, shortness of breath, chest pain, abdominal pain, severe nausea/vomiting, or problems with urination or bowel movements unless otherwise stated above. Pertinent History Reviewed:  Reviewed past medical,surgical, social, obstetrical and family history.  Reviewed problem list, medications and allergies.  Physical Assessment:   Vitals:   04/11/21 1223  BP: 96/62  Pulse: 70  Weight: 122 lb 3.2 oz (55.4 kg)  Body mass index is 25.54 kg/m.        Physical Examination:   General appearance: Well appearing, and in no distress  Mental status: Alert, oriented to person, place, and time  Skin: Warm & dry  Respiratory: Normal respiratory effort, no distress  Abdomen: Soft, gravid, nontender  Pelvic: Cervical exam deferred         Extremities: Edema: None  Psych:  mood and affect appropriate  Fetal Status:     Movement: Present    [place] 18+2 wks,cephalic,posterior placenta gr 0,normal ovaries,CX 3.2 cm,SVP of fluid 5  cm,FHR 146 BPM,EFW 262 g 79%,anatomy complete,no obvious abnormalities  Chaperone: n/a    No results found for this or any previous visit (from the past 24 hour(s)).   Assessment & Plan:  1) Low-risk pregnancy G1P0 at [redacted]w[redacted]d with an Estimated Date of Delivery: 09/10/21   Anatomy scan reviewed   Meds: No orders of the defined types were placed in this encounter.  Labs/procedures today: anatomy scan  Plan:  Continue routine obstetrical care  Next visit: prefers in person    Reviewed: Preterm labor symptoms and general obstetric precautions including but not limited to vaginal bleeding, contractions, leaking of fluid and fetal movement were reviewed in detail with the patient.  All questions were answered.   Follow-up: Return in about 4 weeks (around 05/09/2021) for LROB visit.  No orders of the defined types were placed in this encounter.   Myna Hidalgo, DO Attending Obstetrician & Gynecologist, [facility] for [employer], [facility]

## 2021-05-09 ENCOUNTER — Ambulatory Visit (INDEPENDENT_AMBULATORY_CARE_PROVIDER_SITE_OTHER): Payer: Medicaid Other | Admitting: Advanced Practice Midwife

## 2021-05-09 ENCOUNTER — Other Ambulatory Visit: Payer: Self-pay

## 2021-05-09 VITALS — BP 108/69 | HR 87 | Wt 133.0 lb

## 2021-05-09 DIAGNOSIS — Z3402 Encounter for supervision of normal first pregnancy, second trimester: Secondary | ICD-10-CM

## 2021-05-09 DIAGNOSIS — Z148 Genetic carrier of other disease: Secondary | ICD-10-CM | POA: Insufficient documentation

## 2021-05-09 DIAGNOSIS — Z3A22 22 weeks gestation of pregnancy: Secondary | ICD-10-CM

## 2021-05-09 NOTE — Progress Notes (Signed)
° °  LOW-RISK PREGNANCY VISIT Patient name: Theresa Lane MRN [id number]  Date of birth: 08/15/2000 Chief Complaint:   Routine Prenatal Visit  History of Present Illness:   Theresa Lane is a 21 y.o. G1P0 female at [redacted]w[redacted]d with an Estimated Date of Delivery: 09/10/21 being seen today for ongoing management of a low-risk pregnancy.  Today she reports no complaints. Contractions: Not present. Vag. Bleeding: None.  Movement: Present. denies leaking of fluid. Review of Systems:   Pertinent items are noted in HPI Denies abnormal vaginal discharge w/ itching/odor/irritation, headaches, visual changes, shortness of breath, chest pain, abdominal pain, severe nausea/vomiting, or problems with urination or bowel movements unless otherwise stated above. Pertinent History Reviewed:  Reviewed past medical,surgical, social, obstetrical and family history.  Reviewed problem list, medications and allergies. Physical Assessment:   Vitals:   05/09/21 0911  BP: 108/69  Pulse: 87  Weight: 133 lb (60.3 kg)  Body mass index is 27.8 kg/m.        Physical Examination:   General appearance: Well appearing, and in no distress  Mental status: Alert, oriented to person, place, and time  Skin: Warm & dry  Cardiovascular: Normal heart rate noted  Respiratory: Normal respiratory effort, no distress  Abdomen: Soft, gravid, nontender  Pelvic: Cervical exam deferred         Extremities: Edema: None  Fetal Status: Fetal Heart Rate (bpm): 149 Fundal Height: 22 cm Movement: Present    No results found for this or any previous visit (from the past 24 hour(s)).  Assessment & Plan:  1) Low-risk pregnancy G1P0 at [redacted]w[redacted]d with an Estimated Date of Delivery: 09/10/21     Meds: No orders of the defined types were placed in this encounter.  Labs/procedures today: none  Plan:  Continue routine obstetrical care   Reviewed: Preterm labor symptoms and general obstetric precautions including but not limited to  vaginal bleeding, contractions, leaking of fluid and fetal movement were reviewed in detail with the patient.  All questions were answered. Has home bp cuff. Check bp weekly, let us know if >140/90.   Follow-up: Return in about 4 weeks (around 06/06/2021) for LROB, PN2, in person.  No orders of the defined types were placed in this encounter.  Arabella Merles CNM 05/09/2021 9:26 AM

## 2021-05-09 NOTE — Patient Instructions (Signed)
Theresa Lane, I greatly value your feedback.  If you receive a survey following your visit with [place] today, we appreciate you taking the time to fill it out.  Thanks, Philipp Deputy, CNM   You will have your sugar test next visit.  Please do not eat or drink anything after midnight the night before you come, not even water.  You will be here for at least two hours.  Please make an appointment online for the bloodwork at [email or web address] for 8:30am (or as close to this as possible). Make sure you select the [facility] service center. The day of the appointment, check in with our office first, then you will go to [facility] to start the sugar test.    [facility] HAS MOVED!!! It is now [facility] ([address] [place], [place] [address]) Entrance C, located off of [address] valet parking  Go to [email or web address] to register for FREE online childbirth classes   Call the office [phone]) or go to [facility] if: You begin to have strong, frequent contractions Your water breaks.  Sometimes it is a big gush of fluid, sometimes it is just a trickle that keeps getting your panties wet or running down your legs You have vaginal bleeding.  It is normal to have a small amount of spotting if your cervix was checked.  You don't feel your baby moving like normal.  If you don't, get you something to eat and drink and lay down and focus on feeling your baby move.   If your baby is still not moving like normal, you should call the office or go to [facility].  [place] Pediatricians/Family Doctors: [facility] [phone]           [facility] [phone]                [facility] [phone] (usually not accepting new patients unless you have family there already, you are always welcome to call and ask)      [facility] Department [phone]       [facility] Pediatricians/Family Doctors:  [facility]: [phone] [facility]: [phone] [facility]: [phone]  [facility] Doctors:  [facility]: [phone]  [facility]: [phone]  [facility] Doctors: [facility]: [phone]   Home Blood Pressure Monitoring for Patients   Your provider has recommended that you check your blood pressure (BP) at least once a week at home. If you do not have a blood pressure cuff at home, one will be provided for you. Contact your provider if you have not received your monitor within 1 week.   Helpful Tips for Accurate Home Blood Pressure Checks  Don't smoke, exercise, or drink caffeine 30 minutes before checking your BP Use the restroom before checking your BP (a full bladder can raise your pressure) Relax in a comfortable upright chair Feet on the ground Left arm resting comfortably on a flat surface at the level of your heart Legs uncrossed Back supported Sit quietly and don't talk Place the cuff on your bare arm Adjust snuggly, so that only two fingertips can fit between your skin and the top of the cuff Check 2 readings separated by at least one minute Keep a log of your BP readings For a visual, please reference this diagram: http://ccnc.care/bpdiagram  Provider Name: [facility]     Phone: [phone]  Zone 1: ALL CLEAR  Continue to monitor your symptoms:  BP reading is less than 140 (top number) or less than 90 (bottom number)  No right upper stomach pain No headaches or seeing spots No feeling nauseated or throwing up No swelling in face and hands  Zone 2: CAUTION Call your doctor's office for any of the following:  BP reading is greater than 140 (top number) or greater than 90 (bottom number)  Stomach pain under your ribs in the middle or right side Headaches or seeing spots Feeling nauseated or throwing up Swelling in face and hands  Zone 3: EMERGENCY   Seek immediate medical care if you have any of the following:  BP reading is greater than160 (top number) or greater than 110 (bottom number) Severe headaches not improving with Tylenol Serious difficulty catching your breath Any worsening symptoms from Zone 2   Second Trimester of Pregnancy The second trimester is from week 13 through week 28, months 4 through 6. The second trimester is often a time when you feel your best. Your body has also adjusted to being pregnant, and you begin to feel better physically. Usually, morning sickness has lessened or quit completely, you may have more energy, and you may have an increase in appetite. The second trimester is also a time when the fetus is growing rapidly. At the end of the sixth month, the fetus is about 9 inches long and weighs about 1 pounds. You will likely begin to feel the baby move (quickening) between 18 and 20 weeks of the pregnancy. BODY CHANGES Your body goes through many changes during pregnancy. The changes vary from woman to woman.  Your weight will continue to increase. You will notice your lower abdomen bulging out. You may begin to get stretch marks on your hips, abdomen, and breasts. You may develop headaches that can be relieved by medicines approved by your health care provider. You may urinate more often because the fetus is pressing on your bladder. You may develop or continue to have heartburn as a result of your pregnancy. You may develop constipation because certain hormones are causing the muscles that push waste through your intestines to slow down. You may develop hemorrhoids or swollen, bulging veins (varicose veins). You may have back pain because of the weight gain and pregnancy hormones relaxing your joints between the bones in your pelvis and as a result of a shift in weight and the muscles that support your balance. Your breasts will continue to grow and be tender. Your gums may bleed and may be sensitive to  brushing and flossing. Dark spots or blotches (chloasma, mask of pregnancy) may develop on your face. This will likely fade after the baby is born. A dark line from your belly button to the pubic area (linea nigra) may appear. This will likely fade after the baby is born. You may have changes in your hair. These can include thickening of your hair, rapid growth, and changes in texture. Some women also have hair loss during or after pregnancy, or hair that feels dry or thin. Your hair will most likely return to normal after your baby is born. WHAT TO EXPECT AT YOUR PRENATAL VISITS During a routine prenatal visit: You will be weighed to make sure you and the fetus are growing normally. Your blood pressure will be taken. Your abdomen will be measured to track your baby's growth. The fetal heartbeat will be listened to. Any test results from the previous visit will be discussed. Your health care provider  may ask you: How you are feeling. If you are feeling the baby move. If you have had any abnormal symptoms, such as leaking fluid, bleeding, severe headaches, or abdominal cramping. If you have any questions. Other tests that may be performed during your second trimester include: Blood tests that check for: Low iron levels (anemia). Gestational diabetes (between 24 and 28 weeks). Rh antibodies. Urine tests to check for infections, diabetes, or protein in the urine. An ultrasound to confirm the proper growth and development of the baby. An amniocentesis to check for possible genetic problems. Fetal screens for spina bifida and Down syndrome. HOME CARE INSTRUCTIONS  Avoid all smoking, herbs, alcohol, and unprescribed drugs. These chemicals affect the formation and growth of the baby. Follow your health care provider's instructions regarding medicine use. There are medicines that are either safe or unsafe to take during pregnancy. Exercise only as directed by your health care provider.  Experiencing uterine cramps is a good sign to stop exercising. Continue to eat regular, healthy meals. Wear a good support bra for breast tenderness. Do not use hot tubs, steam rooms, or saunas. Wear your seat belt at all times when driving. Avoid raw meat, uncooked cheese, cat litter boxes, and soil used by cats. These carry germs that can cause birth defects in the baby. Take your prenatal vitamins. Try taking a stool softener (if your health care provider approves) if you develop constipation. Eat more high-fiber foods, such as fresh vegetables or fruit and whole grains. Drink plenty of fluids to keep your urine clear or pale yellow. Take warm sitz baths to soothe any pain or discomfort caused by hemorrhoids. Use hemorrhoid cream if your health care provider approves. If you develop varicose veins, wear support hose. Elevate your feet for 15 minutes, 3-4 times a day. Limit salt in your diet. Avoid heavy lifting, wear low heel shoes, and practice good posture. Rest with your legs elevated if you have leg cramps or low back pain. Visit your dentist if you have not gone yet during your pregnancy. Use a soft toothbrush to brush your teeth and be gentle when you floss. A sexual relationship may be continued unless your health care provider directs you otherwise. Continue to go to all your prenatal visits as directed by your health care provider. SEEK MEDICAL CARE IF:  You have dizziness. You have mild pelvic cramps, pelvic pressure, or nagging pain in the abdominal area. You have persistent nausea, vomiting, or diarrhea. You have a bad smelling vaginal discharge. You have pain with urination. SEEK IMMEDIATE MEDICAL CARE IF:  You have a fever. You are leaking fluid from your vagina. You have spotting or bleeding from your vagina. You have severe abdominal cramping or pain. You have rapid weight gain or loss. You have shortness of breath with chest pain. You notice sudden or extreme swelling  of your face, hands, ankles, feet, or legs. You have not felt your baby move in over an hour. You have severe headaches that do not go away with medicine. You have vision changes. Document Released: 02/26/2001 Document Revised: 03/09/2013 Document Reviewed: 05/05/2012 [facility] Patient Information 2015 [place], [place]. This information is not intended to replace advice given to you by your health care provider. Make sure you discuss any questions you have with your health care provider.

## 2021-06-06 ENCOUNTER — Ambulatory Visit (INDEPENDENT_AMBULATORY_CARE_PROVIDER_SITE_OTHER): Payer: Medicaid Other | Admitting: Advanced Practice Midwife

## 2021-06-06 ENCOUNTER — Encounter: Payer: Self-pay | Admitting: Advanced Practice Midwife

## 2021-06-06 ENCOUNTER — Other Ambulatory Visit: Payer: Medicaid Other

## 2021-06-06 ENCOUNTER — Other Ambulatory Visit: Payer: Self-pay

## 2021-06-06 VITALS — BP 102/66 | HR 95 | Wt 141.0 lb

## 2021-06-06 DIAGNOSIS — Z3402 Encounter for supervision of normal first pregnancy, second trimester: Secondary | ICD-10-CM

## 2021-06-06 DIAGNOSIS — Z3A26 26 weeks gestation of pregnancy: Secondary | ICD-10-CM

## 2021-06-06 DIAGNOSIS — Z131 Encounter for screening for diabetes mellitus: Secondary | ICD-10-CM

## 2021-06-06 LAB — POCT URINALYSIS DIPSTICK OB
Blood, UA: NEGATIVE
Glucose, UA: NEGATIVE
Ketones, UA: NEGATIVE
Leukocytes, UA: NEGATIVE
Nitrite, UA: NEGATIVE
POC,PROTEIN,UA: NEGATIVE

## 2021-06-06 NOTE — Progress Notes (Signed)
? ?  LOW-RISK PREGNANCY VISIT ?Patient name: Theresa Lane MRN [id number]  Date of birth: 2000/06/10 ?Chief Complaint:   ?Routine Prenatal Visit (PN2 today; having some pain on left side) ? ?History of Present Illness:   ?Theresa Lane is a 21 y.o. G1P0 female at [redacted]w[redacted]d with an Estimated Date of Delivery: 09/10/21 being seen today for ongoing management of a low-risk pregnancy.  ?Today she reports  having low abd left-mid cramping approx 20x/day since last week; denies leaking, bldg, or dysuria . Contractions: Not present. Vag. Bleeding: None.  Movement: Present. denies leaking of fluid. ?Review of Systems:   ?Pertinent items are noted in HPI ?Denies abnormal vaginal discharge w/ itching/odor/irritation, headaches, visual changes, shortness of breath, chest pain, abdominal pain, severe nausea/vomiting, or problems with urination or bowel movements unless otherwise stated above. ?Pertinent History Reviewed:  ?Reviewed past medical,surgical, social, obstetrical and family history.  ?Reviewed problem list, medications and allergies. ?Physical Assessment:  ? ?Vitals:  ? 06/06/21 0850  ?BP: 102/66  ?Pulse: 95  ?Weight: 141 lb (64 kg)  ?Body mass index is 29.47 kg/m?. ?  ?     Physical Examination:  ? General appearance: Well appearing, and in no distress ? Mental status: Alert, oriented to person, place, and time ? Skin: Warm & dry ? Cardiovascular: Normal heart rate noted ? Respiratory: Normal respiratory effort, no distress ? Abdomen: Soft, gravid, nontender ? Pelvic: Cervical exam performed  Dilation: Closed Effacement (%): Thick Station: -2 ? Extremities: Edema: None ? ?Fetal Status: Fetal Heart Rate (bpm): 133 Fundal Height: 26 cm Movement: Present   ? ?Results for orders placed or performed in visit on 06/06/21 (from the past 24 hour(s))  ?POC Urinalysis Dipstick OB  ? Collection Time: 06/06/21  9:20 AM  ?Result Value Ref Range  ? Color, UA    ? Clarity, UA    ? Glucose, UA Negative Negative  ? Bilirubin, UA     ? Ketones, UA neg   ? Spec Grav, UA    ? Blood, UA neg   ? pH, UA    ? POC,PROTEIN,UA Negative Negative, Trace, Small (1+), Moderate (2+), Large (3+), 4+  ? Urobilinogen, UA    ? Nitrite, UA neg   ? Leukocytes, UA Negative Negative  ? Appearance    ? Odor    ?  ?Assessment & Plan:  ?1) Low-risk pregnancy G1P0 at [redacted]w[redacted]d with an Estimated Date of Delivery: 09/10/21  ? ?2) LLQ cramping, UA neg and cx closed; rev'd s/s PTL and when to call; given comfort measures ?  ?Meds: No orders of the defined types were placed in this encounter. ? ?Labs/procedures today: PN2, Tdap info given ? ?Plan:  Continue routine obstetrical care  ? ?Reviewed: Preterm labor symptoms and general obstetric precautions including but not limited to vaginal bleeding, contractions, leaking of fluid and fetal movement were reviewed in detail with the patient.  All questions were answered. Has home bp cuff. Check bp weekly, let us know if >140/90.  ? ?Follow-up: Return in about 4 weeks (around 07/04/2021) for LROB, in person. ? ?Orders Placed This Encounter  ?Procedures  ? POC Urinalysis Dipstick OB  ? ?Arabella Merles CNM ?06/06/2021 ?9:33 AM  ?

## 2021-06-06 NOTE — Patient Instructions (Signed)
Theresa Lane, thank you for choosing our office today! We appreciate the opportunity to meet your healthcare needs. You may receive a short survey by mail, e-mail, or through [employer]. If you are happy with your care we would appreciate if you could take just a few minutes to complete the survey questions. We read all of your comments and take your feedback very seriously. Thank you again for choosing our office.  ?Center for [employer] Team at [facility] ?Women's & Children's Center at [facility] ?([address] [place], [place] [address]) ?Entrance C, located off of [address] ?Free 24/7 valet parking  ?Go to [email or web address] to register for FREE online childbirth classes ? ?Call the office [phone]) or go to [facility] if: ?You begin to severe cramping ?Your water breaks.  Sometimes it is a big gush of fluid, sometimes it is just a trickle that keeps getting your panties wet or running down your legs ?You have vaginal bleeding.  It is normal to have a small amount of spotting if your cervix was checked.  ? ?[place] Pediatricians/Family Doctors ?[facility]): [address], [phone]           ?[facility]: [address], [phone]                ?[facility]): [address], [phone] (call to ask if accepting patients) ?[facility] Department: [address], [place], [phone]   ? ?[place] Pediatricians/Family Doctors ?[facility]): [address], [phone] ?[facility]: [address] [place], [phone] ?[facility]: [address], [phone] ? ?[employer] Family Doctors  ?[facility]): [phone] ?[facility]: [address], [phone]  ? ?[facility] Family Doctors ?[facility]: [address] [phone] ? ?[facility]  ?[facility]: [phone], [phone] ? ?Home Blood Pressure Monitoring for Patients  ? ?Your provider has recommended that you check your blood pressure (BP) at least once a week at home. If you do not have a blood pressure cuff at home, one will be provided for you. Contact your provider if you have not received your monitor within 1 week.  ? ?Helpful Tips for Accurate Home Blood Pressure Checks  ?Don't smoke, exercise, or drink caffeine 30 minutes before checking your BP ?Use the restroom before checking your BP (a full bladder can raise your pressure) ?Relax in a comfortable upright chair ?Feet on the ground ?Left arm resting comfortably on a flat surface at the level of your heart ?Legs uncrossed ?Back supported ?Sit quietly and don't talk ?Place the cuff on your bare arm ?Adjust snuggly, so that only two fingertips can fit between your skin and the top of the cuff ?Check 2 readings separated by at least one minute ?Keep a log of your BP readings ?For a visual, please reference this diagram: http://ccnc.care/bpdiagram ? ?Provider Name: [facility]     Phone: [phone] ? ?Zone 1: ALL CLEAR  ?Continue to monitor your symptoms:  ?BP reading is less than 140 (top number) or less than 90 (bottom number)  ?No right upper stomach pain ?No headaches or seeing spots ?No feeling nauseated or throwing up ?No swelling in face and hands ? ?Zone 2: CAUTION ?Call your doctor's office for any of the following:  ?BP reading is greater than 140 (top number) or greater than  90 (bottom number)  ?Stomach pain under your ribs in the middle or right side ?Headaches or seeing spots ?Feeling nauseated or throwing up ?Swelling in face and hands ? ?Zone 3: EMERGENCY  ?Seek immediate medical care if you have any of the following:  ?BP reading is greater than160 (top number) or greater than 110 (bottom number) ?Severe headaches not improving with Tylenol ?Serious difficulty catching your breath ?Any worsening symptoms from  Zone 2  ? ?  ?Second Trimester of Pregnancy ?The second trimester is from week 14 through week 27 (months 4 through 6). The second trimester is often a time when you feel your best. Your body has adjusted to being pregnant, and you begin to feel better physically. Usually, morning sickness has lessened or quit completely, you may have more energy, and you may have an increase in appetite. The second trimester is also a time when the fetus is growing rapidly. At the end of the sixth month, the fetus is about 9 inches long and weighs about 1? pounds. You will likely begin to feel the baby move (quickening) between 16 and 20 weeks of pregnancy. ?Body changes during your second trimester ?Your body continues to go through many changes during your second trimester. The changes vary from woman to woman. ?Your weight will continue to increase. You will notice your lower abdomen bulging out. ?You may begin to get stretch marks on your hips, abdomen, and breasts. ?You may develop headaches that can be relieved by medicines. The medicines should be approved by your health care provider. ?You may urinate more often because the fetus is pressing on your bladder. ?You may develop or continue to have heartburn as a result of your pregnancy. ?You may develop constipation because certain hormones are causing the muscles that push waste through your intestines to slow down. ?You may develop hemorrhoids or swollen, bulging veins (varicose veins). ?You may have back pain. This is caused by: ?Weight gain. ?Pregnancy hormones that are relaxing the joints in your pelvis. ?A shift in weight and the muscles that support your balance. ?Your breasts will continue to grow and they will continue to become tender. ?Your gums may bleed and may be sensitive to brushing and flossing. ?Dark spots or blotches (chloasma, mask of pregnancy) may develop on your face. This will likely fade after the baby is born. ?A dark line from your belly button to  the pubic area (linea nigra) may appear. This will likely fade after the baby is born. ?You may have changes in your hair. These can include thickening of your hair, rapid growth, and changes in texture. Some women also have hair loss during or after pregnancy, or hair that feels dry or thin. Your hair will most likely return to normal after your baby is born. ? ?What to expect at prenatal visits ?During a routine prenatal visit: ?You will be weighed to make sure you and the fetus are growing normally. ?Your blood pressure will be taken. ?Your abdomen will be measured to track your baby's growth. ?The fetal heartbeat will be listened to. ?Any test results from the previous visit will be discussed. ? ?Your health care provider may ask you: ?How you are feeling. ?If you are feeling the baby move. ?If you have had any abnormal symptoms, such as leaking fluid, bleeding, severe headaches, or abdominal cramping. ?If you are using any tobacco products, including cigarettes, chewing tobacco, and electronic cigarettes. ?If you have any questions. ? ?Other tests that may be performed during  your second trimester include: ?Blood tests that check for: ?Low iron levels (anemia). ?High blood sugar that affects pregnant women (gestational diabetes) between 31 and 28 weeks. ?Rh antibodies. This is to check for a protein on red blood cells (Rh factor). ?Urine tests to check for infections, diabetes, or protein in the urine. ?An ultrasound to confirm the proper growth and development of the baby. ?An amniocentesis to check for possible genetic problems. ?Fetal screens for spina bifida and Down syndrome. ?HIV (human immunodeficiency virus) testing. Routine prenatal testing includes screening for HIV, unless you choose not to have this test. ? ?Follow these instructions at home: ?Medicines ?Follow your health care provider's instructions regarding medicine use. Specific medicines may be either safe or unsafe to take during  pregnancy. ?Take a prenatal vitamin that contains at least 600 micrograms (mcg) of folic acid. ?If you develop constipation, try taking a stool softener if your health care provider approves. ?Eating and drinking ?Eat a

## 2021-06-07 LAB — CBC
Hematocrit: 35.2 % (ref 34.0–46.6)
Hemoglobin: 11.7 g/dL (ref 11.1–15.9)
MCH: 28.7 pg (ref 26.6–33.0)
MCHC: 33.2 g/dL (ref 31.5–35.7)
MCV: 86 fL (ref 79–97)
Platelets: 273 10*3/uL (ref 150–450)
RBC: 4.08 x10E6/uL (ref 3.77–5.28)
RDW: 13.4 % (ref 11.7–15.4)
WBC: 8.4 10*3/uL (ref 3.4–10.8)

## 2021-06-07 LAB — RPR: RPR Ser Ql: NONREACTIVE

## 2021-06-07 LAB — ANTIBODY SCREEN: Antibody Screen: NEGATIVE

## 2021-06-07 LAB — GLUCOSE TOLERANCE, 2 HOURS W/ 1HR
Glucose, 1 hour: 68 mg/dL — ABNORMAL LOW (ref 70–179)
Glucose, 2 hour: 69 mg/dL — ABNORMAL LOW (ref 70–152)
Glucose, Fasting: 69 mg/dL — ABNORMAL LOW (ref 70–91)

## 2021-06-07 LAB — HIV ANTIBODY (ROUTINE TESTING W REFLEX): HIV Screen 4th Generation wRfx: NONREACTIVE

## 2021-07-04 ENCOUNTER — Ambulatory Visit (INDEPENDENT_AMBULATORY_CARE_PROVIDER_SITE_OTHER): Payer: Medicaid Other | Admitting: Medical

## 2021-07-04 ENCOUNTER — Encounter: Payer: Self-pay | Admitting: Medical

## 2021-07-04 VITALS — BP 102/60 | HR 89 | Wt 148.0 lb

## 2021-07-04 DIAGNOSIS — Z3403 Encounter for supervision of normal first pregnancy, third trimester: Secondary | ICD-10-CM

## 2021-07-04 DIAGNOSIS — Z148 Genetic carrier of other disease: Secondary | ICD-10-CM

## 2021-07-04 DIAGNOSIS — Z2839 Other underimmunization status: Secondary | ICD-10-CM

## 2021-07-04 DIAGNOSIS — Z3A30 30 weeks gestation of pregnancy: Secondary | ICD-10-CM

## 2021-07-04 DIAGNOSIS — O09899 Supervision of other high risk pregnancies, unspecified trimester: Secondary | ICD-10-CM

## 2021-07-04 NOTE — Progress Notes (Signed)
? ?  PRENATAL VISIT NOTE ? ?Subjective:  ?Theresa Lane is a 21 y.o. G1P0 at 15w2dbeing seen today for ongoing prenatal care.  She is currently monitored for the following issues for this low-risk pregnancy and has Supervision of normal first pregnancy; Rubella non-immune status, antepartum; and Carrier of spinal muscular atrophy on their problem list. ? ?Patient reports no complaints.  Contractions: Not present. Vag. Bleeding: None.  Movement: Present. Denies leaking of fluid.  ? ?The following portions of the patient's history were reviewed and updated as appropriate: allergies, current medications, past family history, past medical history, past social history, past surgical history and problem list.  ? ?Objective:  ? ?Vitals:  ? 07/04/21 0846  ?BP: 102/60  ?Pulse: 89  ?Weight: 148 lb (67.1 kg)  ? ? ?Fetal Status: Fetal Heart Rate (bpm): 145 Fundal Height: 33 cm Movement: Present    ? ?General:  Alert, oriented and cooperative. Patient is in no acute distress.  ?Skin: Skin is warm and dry. No rash noted.   ?Cardiovascular: Normal heart rate noted  ?Respiratory: Normal respiratory effort, no problems with respiration noted  ?Abdomen: Soft, gravid, appropriate for gestational age.  Pain/Pressure: Absent     ?Pelvic: Cervical exam deferred        ?Extremities: Normal range of motion.  Edema: None  ?Mental Status: Normal mood and affect. Normal behavior. Normal judgment and thought content.  ? ?Assessment and Plan:  ?Pregnancy: G1P0 at 374w2d1. Encounter for supervision of normal first pregnancy in third trimester ?- Doing well, no complaints ?- Abdominal pain much improved from last visit  ? ?2. Rubella non-immune status, antepartum ?- PP MMR ? ?3. Carrier of spinal muscular atrophy ? ?4. [redacted] weeks gestation of pregnancy ? ?Preterm labor symptoms and general obstetric precautions including but not limited to vaginal bleeding, contractions, leaking of fluid and fetal movement were reviewed in detail with the  patient. ?Please refer to After Visit Summary for other counseling recommendations.  ? ?Return in about 2 weeks (around 07/18/2021) for LOB, any provider, In-Person. ? ?No future appointments. ? ?JuKerry HoughPA-C ? ?

## 2021-07-19 ENCOUNTER — Ambulatory Visit (INDEPENDENT_AMBULATORY_CARE_PROVIDER_SITE_OTHER): Payer: Medicaid Other | Admitting: Obstetrics & Gynecology

## 2021-07-19 VITALS — BP 108/73 | HR 95 | Wt 153.0 lb

## 2021-07-19 DIAGNOSIS — Z3403 Encounter for supervision of normal first pregnancy, third trimester: Secondary | ICD-10-CM

## 2021-07-19 NOTE — Progress Notes (Signed)
? ?  LOW-RISK PREGNANCY VISIT ?Patient name: Theresa Lane MRN [id number]  Date of birth: [birth year] ?Chief Complaint:   ?Routine Prenatal Visit ? ?History of Present Illness:   ?Theresa Lane is a 21 y.o. G1P0 female at [redacted]w[redacted]d with an Estimated Date of Delivery: 09/10/21 being seen today for ongoing management of a low-risk pregnancy.  ? ?  06/06/2021  ?  8:52 AM 03/08/2021  ? 10:32 AM  ?Depression screen PHQ 2/9  ?Decreased Interest 2 1  ?Down, Depressed, Hopeless 1 0  ?PHQ - 2 Score 3 1  ?Altered sleeping 2 2  ?Tired, decreased energy 2 2  ?Change in appetite 1 2  ?Feeling bad or failure about yourself  0 0  ?Trouble concentrating 1 1  ?Moving slowly or fidgety/restless 1 1  ?Suicidal thoughts 0 0  ?PHQ-9 Score 10 9  ? ? ?Today she reports no complaints. Contractions: Not present. Vag. Bleeding: None.  Movement: Present. denies leaking of fluid. ?Review of Systems:   ?Pertinent items are noted in HPI ?Denies abnormal vaginal discharge w/ itching/odor/irritation, headaches, visual changes, shortness of breath, chest pain, abdominal pain, severe nausea/vomiting, or problems with urination or bowel movements unless otherwise stated above. ?Pertinent History Reviewed:  ?Reviewed past medical,surgical, social, obstetrical and family history.  ?Reviewed problem list, medications and allergies. ?Physical Assessment:  ? ?Vitals:  ? 07/19/21 0955  ?BP: 108/73  ?Pulse: 95  ?Weight: 153 lb (69.4 kg)  ?Body mass index is 31.98 kg/m?. ?  ?     Physical Examination:  ? General appearance: Well appearing, and in no distress ? Mental status: Alert, oriented to person, place, and time ? Skin: Warm & dry ? Cardiovascular: Normal heart rate noted ? Respiratory: Normal respiratory effort, no distress ? Abdomen: Soft, gravid, nontender ? Pelvic: Cervical exam deferred        ? Extremities: Edema: None ? ?Fetal Status: Fetal Heart Rate (bpm): 134 Fundal Height: 33 cm Movement: Present   ? ?Chaperone: n/a   ? ?No results found  for this or any previous visit (from the past 24 hour(s)).  ?Assessment & Plan:  ?1) Low-risk pregnancy G1P0 at [redacted]w[redacted]d with an Estimated Date of Delivery: 09/10/21  ? ? ?  ?Meds: No orders of the defined types were placed in this encounter. ? ?Labs/procedures today:  ? ?Plan:  Continue routine obstetrical care  ?Next visit: prefers in person   ? ? ? ?Follow-up: Return in about 2 weeks (around 08/02/2021) for LROB. ? ?No orders of the defined types were placed in this encounter. ? ? ?Lazaro Arms, MD ?07/19/2021 ?10:14 AM' ?

## 2021-08-02 ENCOUNTER — Encounter: Payer: Self-pay | Admitting: Advanced Practice Midwife

## 2021-08-02 ENCOUNTER — Ambulatory Visit (INDEPENDENT_AMBULATORY_CARE_PROVIDER_SITE_OTHER): Payer: Medicaid Other | Admitting: Advanced Practice Midwife

## 2021-08-02 VITALS — BP 120/80 | HR 86 | Wt 157.2 lb

## 2021-08-02 DIAGNOSIS — Z3403 Encounter for supervision of normal first pregnancy, third trimester: Secondary | ICD-10-CM

## 2021-08-02 DIAGNOSIS — Z3A34 34 weeks gestation of pregnancy: Secondary | ICD-10-CM

## 2021-08-02 NOTE — Patient Instructions (Signed)

## 2021-08-02 NOTE — Progress Notes (Signed)
   LOW-RISK PREGNANCY VISIT Patient name: Theresa Lane MRN [id number]  Date of birth: January 28, 2001 Chief Complaint:   Routine Prenatal Visit  History of Present Illness:   Lea Baine is a 21 y.o. G1P0 female at [redacted]w[redacted]d with an Estimated Date of Delivery: 09/10/21 being seen today for ongoing management of a low-risk pregnancy.  Today she reports right eye itching.  Isn't red, probably allergies.  . Contractions: Not present.  .  Movement: Present. denies leaking of fluid. Review of Systems:   Pertinent items are noted in HPI Denies abnormal vaginal discharge w/ itching/odor/irritation, headaches, visual changes, shortness of breath, chest pain, abdominal pain, severe nausea/vomiting, or problems with urination or bowel movements unless otherwise stated above. Pertinent History Reviewed:  Reviewed past medical,surgical, social, obstetrical and family history.  Reviewed problem list, medications and allergies. Physical Assessment:   Vitals:   08/02/21 1050  BP: 120/80  Pulse: 86  Weight: 157 lb 3.2 oz (71.3 kg)  Body mass index is 32.85 kg/m.        Physical Examination:   General appearance: Well appearing, and in no distress  Mental status: Alert, oriented to person, place, and time  Skin: Warm & dry  Cardiovascular: Normal heart rate noted  Respiratory: Normal respiratory effort, no distress  Abdomen: Soft, gravid, nontender  Pelvic: Cervical exam deferred         Extremities: Edema: None  Fetal Status: Fetal Heart Rate (bpm): 127 Fundal Height: 34 cm Movement: Present    Chaperone: n/a    No results found for this or any previous visit (from the past 24 hour(s)).  Assessment & Plan:  1) Low-risk pregnancy G1P0 at [redacted]w[redacted]d with an Estimated Date of Delivery: 09/10/21   2) , Eye itch--may take oral allergy meds/eye drops   Meds: No orders of the defined types were placed in this encounter.  Labs/procedures today:   Plan:  Continue routine obstetrical care  Next  visit: prefers in person    Reviewed: Preterm labor symptoms and general obstetric precautions including but not limited to vaginal bleeding, contractions, leaking of fluid and fetal movement were reviewed in detail with the patient.  All questions were answered. Has home bp cuff.. Check bp weekly, let us know if >140/90.   Follow-up: Return in about 2 weeks (around 08/16/2021) for LROB.  No orders of the defined types were placed in this encounter.  Jacklyn Shell DNP, CNM 08/02/2021 11:16 AM

## 2021-08-16 ENCOUNTER — Encounter: Payer: Self-pay | Admitting: Advanced Practice Midwife

## 2021-08-16 ENCOUNTER — Other Ambulatory Visit (HOSPITAL_COMMUNITY)
Admission: RE | Admit: 2021-08-16 | Discharge: 2021-08-16 | Disposition: A | Discharge status: Home / Self Care | Payer: Medicaid Other | Source: Ambulatory Visit | Attending: Advanced Practice Midwife | Admitting: Advanced Practice Midwife

## 2021-08-16 ENCOUNTER — Ambulatory Visit (INDEPENDENT_AMBULATORY_CARE_PROVIDER_SITE_OTHER): Payer: Medicaid Other | Admitting: Advanced Practice Midwife

## 2021-08-16 VITALS — BP 130/88 | HR 84 | Wt 165.0 lb

## 2021-08-16 DIAGNOSIS — Z3403 Encounter for supervision of normal first pregnancy, third trimester: Secondary | ICD-10-CM | POA: Insufficient documentation

## 2021-08-16 DIAGNOSIS — Z3A36 36 weeks gestation of pregnancy: Secondary | ICD-10-CM | POA: Insufficient documentation

## 2021-08-16 NOTE — Patient Instructions (Signed)
AM I IN LABOR? What is labor? Labor is the work that your body does to birth your baby. Your uterus (the womb) contracts. Your cervix (the mouth of the uterus) opens. You will push your baby out into the world.  What do contractions (labor pains) feel like? When they first start, contractions usually feel like cramps during your period. Sometimes you feel pain in your back. Most often, contractions feel like muscles pulling painfully in your lower belly. At first, the contractions will probably be 15 to 20 minutes apart. They will not feel too painful. As labor goes on, the contractions get stronger, closer together, and more painful.  How do I time the contractions? Time your contractions by counting the number of minutes from the start of one contraction to the start of the next contraction.  What should I do when the contractions start? If it is night and you can sleep, sleep. If it happens during the day, here are some things you can do to take care of yourself at home: ? Walk. If the pains you are having are real labor, walking will make the contractions come faster and harder. If the contractions are not going to continue and be real labor, walking will make the contractions slow down. ? Take a shower or bath. This will help you relax. ? Eat. Labor is a big event. It takes a lot of energy. ? Drink water. Not drinking enough water can cause false labor (contractions that hurt but do not open your cervix). If this is true labor, drinking water will help you have strength to get through your labor. ? Take a nap. Get all the rest you can. ? Get a massage. If your labor is in your back, a strong massage on your lower back may feel very good. Getting a foot massage is always good. ? Don't panic. You can do this. Your body was made for this. You are strong!  When should I go to the hospital or call my health care provider? ? Your contractions have been 5 minutes apart or less for at least 1  hour. ? If several contractions are so painful you cannot walk or talk during one. ? Your bag of waters breaks. (You may have a big gush of water or just water that runs down your legs when you walk.)  Are there other reasons to call my health care provider? Yes, you should call your health care provider or go to the hospital if you start to bleed like you are having a period-- blood that soaks your underwear or runs down your legs, if you have sudden severe pain, if your baby has not moved for several hours, or if you are leaking green fluid. The rule is as follows: If you are very concerned about something, call.   Cervical Ripening (to get your cervix ready for labor) : May try one or all:  Red Raspberry Leaf capsules:  two 300mg  or 400mg  tablets with each meal, 2-3 times a day  Potential Side Effects Of Raspberry Leaf:  Most women do not experience any side effects from drinking raspberry leaf tea. However, nausea and loose stools are possible     Evening Primrose Oil capsules: may take 1 to 3 capsules daily. May also prick one to release the oil and insert it into your vagina at night.  Some of the potential side effects:  Upset stomach  Loose stools or diarrhea  Headaches  Nausea   6 Dates a  day (may taste better if warmed in microwave until soft). Found where raisins are in the grocery store

## 2021-08-16 NOTE — Progress Notes (Signed)
   LOW-RISK PREGNANCY VISIT Patient name: Theresa Lane MRN [id number]  Date of birth: 2000/05/18 Chief Complaint:   Routine Prenatal Visit (GBS, GC/CHL)  History of Present Illness:   Theresa Lane is a 21 y.o. G1P0 female at [redacted]w[redacted]d with an Estimated Date of Delivery: 09/10/21 being seen today for ongoing management of a low-risk pregnancy.  Today she reports backache. Contractions: Not present. Vag. Bleeding: None.  Movement: Present. denies leaking of fluid. Review of Systems:   Pertinent items are noted in HPI Denies abnormal vaginal discharge w/ itching/odor/irritation, headaches, visual changes, shortness of breath, chest pain, abdominal pain, severe nausea/vomiting, or problems with urination or bowel movements unless otherwise stated above. Pertinent History Reviewed:  Reviewed past medical,surgical, social, obstetrical and family history.  Reviewed problem list, medications and allergies. Physical Assessment:   Vitals:   08/16/21 1110  BP: 130/88  Pulse: 84  Weight: 165 lb (74.8 kg)  Body mass index is 34.49 kg/m.        Physical Examination:   General appearance: Well appearing, and in no distress  Mental status: Alert, oriented to person, place, and time  Skin: Warm & dry  Cardiovascular: Normal heart rate noted  Respiratory: Normal respiratory effort, no distress  Abdomen: Soft, gravid, nontender  Pelvic: Cervical exam performed  Dilation: 1 Effacement (%): 20 Station: -2  Extremities: Edema: Trace  Fetal Status: Fetal Heart Rate (bpm): 132 Fundal Height: 35 cm Movement: Present Presentation: Vertex  Chaperone: Malachy Mood    No results found for this or any previous visit (from the past 24 hour(s)).  Assessment & Plan:  1) Low-risk pregnancy G1P0 at [redacted]w[redacted]d with an Estimated Date of Delivery: 09/10/21      Meds: No orders of the defined types were placed in this encounter.  Labs/procedures today: GBS, GC, CHL  Plan:  Continue routine obstetrical care   Next visit: prefers in person    Reviewed: Term labor symptoms and general obstetric precautions including but not limited to vaginal bleeding, contractions, leaking of fluid and fetal movement were reviewed in detail with the patient.  All questions were answered. Has home bp cuff.. Check bp weekly, let us know if >140/90.   Follow-up: Return for weekly LROB x4.  Orders Placed This Encounter  Procedures   Strep Gp B NAA   Jacklyn Shell DNP, CNM 08/16/2021 11:24 AM

## 2021-08-17 LAB — CERVICOVAGINAL ANCILLARY ONLY
Chlamydia: NEGATIVE
Comment: NEGATIVE
Comment: NORMAL
Neisseria Gonorrhea: NEGATIVE

## 2021-08-18 LAB — STREP GP B NAA: Strep Gp B NAA: NEGATIVE

## 2021-08-24 ENCOUNTER — Ambulatory Visit (INDEPENDENT_AMBULATORY_CARE_PROVIDER_SITE_OTHER): Payer: Medicaid Other | Admitting: Advanced Practice Midwife

## 2021-08-24 VITALS — BP 112/82 | HR 78 | Wt 166.0 lb

## 2021-08-24 DIAGNOSIS — Z3A37 37 weeks gestation of pregnancy: Secondary | ICD-10-CM

## 2021-08-24 DIAGNOSIS — Z3403 Encounter for supervision of normal first pregnancy, third trimester: Secondary | ICD-10-CM

## 2021-08-24 NOTE — Patient Instructions (Signed)
Try the positions on [email or web address] to help the baby get into a good position for labor!

## 2021-08-24 NOTE — Progress Notes (Signed)
   LOW-RISK PREGNANCY VISIT Patient name: Theresa Lane MRN [id number]  Date of birth: [birth year] Chief Complaint:   Routine Prenatal Visit  History of Present Illness:   Theresa Lane is a 21 y.o. G1P0 female at [redacted]w[redacted]d with an Estimated Date of Delivery: 09/10/21 being seen today for ongoing management of a low-risk pregnancy.  Today she reports heartburn. Contractions: Not present.  .  Movement: Present. denies leaking of fluid. Review of Systems:   Pertinent items are noted in HPI Denies abnormal vaginal discharge w/ itching/odor/irritation, headaches, visual changes, shortness of breath, chest pain, abdominal pain, severe nausea/vomiting, or problems with urination or bowel movements unless otherwise stated above. Pertinent History Reviewed:  Reviewed past medical,surgical, social, obstetrical and family history.  Reviewed problem list, medications and allergies. Physical Assessment:   Vitals:   08/24/21 0830  BP: 112/82  Pulse: 78  Weight: 166 lb (75.3 kg)  Body mass index is 34.69 kg/m.        Physical Examination:   General appearance: Well appearing, and in no distress  Mental status: Alert, oriented to person, place, and time  Skin: Warm & dry  Cardiovascular: Normal heart rate noted  Respiratory: Normal respiratory effort, no distress  Abdomen: Soft, gravid, nontender  Pelvic: Cervical exam deferred         Extremities: Edema: Trace  Fetal Status: Fetal Heart Rate (bpm): 140 Fundal Height: 35 cm Movement: Present Presentation: Vertex  No results found for this or any previous visit (from the past 24 hour(s)).  Assessment & Plan:  1) Low-risk pregnancy G1P0 at [redacted]w[redacted]d with an Estimated Date of Delivery: 09/10/21   2) Heartburn, rec Tums   Meds: No orders of the defined types were placed in this encounter.  Labs/procedures today: none  Plan:  Continue routine obstetrical care   Reviewed: Term labor symptoms and general obstetric precautions including but  not limited to vaginal bleeding, contractions, leaking of fluid and fetal movement were reviewed in detail with the patient.  All questions were answered. Has home bp cuff. Check bp weekly, let us know if >140/90.   Follow-up: Return for As scheduled.  No orders of the defined types were placed in this encounter.  Arabella Merles CNM 08/24/2021 8:58 AM

## 2021-08-31 ENCOUNTER — Ambulatory Visit (INDEPENDENT_AMBULATORY_CARE_PROVIDER_SITE_OTHER): Payer: Medicaid Other | Admitting: Advanced Practice Midwife

## 2021-08-31 ENCOUNTER — Encounter: Payer: Self-pay | Admitting: Advanced Practice Midwife

## 2021-08-31 VITALS — BP 123/85 | HR 85 | Wt 166.0 lb

## 2021-08-31 DIAGNOSIS — Z3A38 38 weeks gestation of pregnancy: Secondary | ICD-10-CM

## 2021-08-31 NOTE — Progress Notes (Signed)
   LOW-RISK PREGNANCY VISIT Patient name: Theresa Lane MRN [id number]  Date of birth: October 07, 2000 Chief Complaint:   Routine Prenatal Visit  History of Present Illness:   Theresa Lane is a 21 y.o. G1P0 female at [redacted]w[redacted]d with an Estimated Date of Delivery: 09/10/21 being seen today for ongoing management of a low-risk pregnancy.  Today she reports no complaints. Contractions: Not present. Vag. Bleeding: None.  Movement: Present. denies leaking of fluid. Review of Systems:   Pertinent items are noted in HPI Denies abnormal vaginal discharge w/ itching/odor/irritation, headaches, visual changes, shortness of breath, chest pain, abdominal pain, severe nausea/vomiting, or problems with urination or bowel movements unless otherwise stated above. Pertinent History Reviewed:  Reviewed past medical,surgical, social, obstetrical and family history.  Reviewed problem list, medications and allergies. Physical Assessment:   Vitals:   08/31/21 1140  BP: 123/85  Pulse: 85  Weight: 166 lb (75.3 kg)  Body mass index is 34.69 kg/m.        Physical Examination:   General appearance: Well appearing, and in no distress  Mental status: Alert, oriented to person, place, and time  Skin: Warm & dry  Cardiovascular: Normal heart rate noted  Respiratory: Normal respiratory effort, no distress  Abdomen: Soft, gravid, nontender  Pelvic: Cervical exam performed  Dilation: Fingertip Effacement (%): 50 Station: -2; bedside u/s: vtx  Extremities: Edema: Trace  Fetal Status: Fetal Heart Rate (bpm): 132 Fundal Height: 39 cm Movement: Present Presentation: Vertex  No results found for this or any previous visit (from the past 24 hour(s)).  Assessment & Plan:  1) Low-risk pregnancy G1P0 at [redacted]w[redacted]d with an Estimated Date of Delivery: 09/10/21     Meds: No orders of the defined types were placed in this encounter.  Labs/procedures today: SVE; bedside u/s  Plan:  Continue routine obstetrical care    Reviewed: Term labor symptoms and general obstetric precautions including but not limited to vaginal bleeding, contractions, leaking of fluid and fetal movement were reviewed in detail with the patient.  All questions were answered. Has home bp cuff.  Check bp weekly, let us know if >140/90.   Follow-up: Return for As scheduled.  No orders of the defined types were placed in this encounter.  Arabella Merles CNM 08/31/2021 12:05 PM

## 2021-08-31 NOTE — Patient Instructions (Signed)
Try the positions on: [email or web address] to help with labor!

## 2021-09-07 ENCOUNTER — Ambulatory Visit (INDEPENDENT_AMBULATORY_CARE_PROVIDER_SITE_OTHER): Payer: Medicaid Other | Admitting: Obstetrics and Gynecology

## 2021-09-07 ENCOUNTER — Encounter: Payer: Self-pay | Admitting: Obstetrics and Gynecology

## 2021-09-07 VITALS — BP 109/62 | HR 79 | Wt 167.8 lb

## 2021-09-07 DIAGNOSIS — O09899 Supervision of other high risk pregnancies, unspecified trimester: Secondary | ICD-10-CM

## 2021-09-07 DIAGNOSIS — Z2839 Other underimmunization status: Secondary | ICD-10-CM

## 2021-09-07 DIAGNOSIS — Z3403 Encounter for supervision of normal first pregnancy, third trimester: Secondary | ICD-10-CM

## 2021-09-07 NOTE — Progress Notes (Signed)
Subjective:  Theresa Lane is a 21 y.o. G1P0 at [redacted]w[redacted]d being seen today for ongoing prenatal care.  She is currently monitored for the following issues for this low-risk pregnancy and has Supervision of normal first pregnancy; Rubella non-immune status, antepartum; and Carrier of spinal muscular atrophy on their problem list.  Patient reports general discomforts of pregnancy. denies HA or visual changes.  Contractions: Not present.  .  Movement: Present. Denies leaking of fluid.   The following portions of the patient's history were reviewed and updated as appropriate: allergies, current medications, past family history, past medical history, past social history, past surgical history and problem list. Problem list updated.  Objective:   Vitals:   09/07/21 0856  BP: (!) 122/95  Pulse: 74  Weight: 167 lb 12.8 oz (76.1 kg)    Fetal Status:   Fundal Height: 39 cm Movement: Present  Presentation: Vertex  General:  Alert, oriented and cooperative. Patient is in no acute distress.  Skin: Skin is warm and dry. No rash noted.   Cardiovascular: Normal heart rate noted  Respiratory: Normal respiratory effort, no problems with respiration noted  Abdomen: Soft, gravid, appropriate for gestational age. Pain/Pressure: Present     Pelvic:  Cervical exam deferred        Extremities: Normal range of motion.  Edema: None  Mental Status: Normal mood and affect. Normal behavior. Normal judgment and thought content.   Urinalysis:      Assessment and Plan:  Pregnancy: G1P0 at [redacted]w[redacted]d  1. Encounter for supervision of normal first pregnancy in third trimester Repeat BP 109/62 Stable Labor precautions IOL scheduled  2. Rubella non-immune status, antepartum Vaccine PP  Term labor symptoms and general obstetric precautions including but not limited to vaginal bleeding, contractions, leaking of fluid and fetal movement were reviewed in detail with the patient. Please refer to After Visit Summary for  other counseling recommendations.  Return in about 1 week (around 09/14/2021) for OB visit, face to face, any provider plus NST.   Hermina Staggers, MD

## 2021-09-10 ENCOUNTER — Encounter (HOSPITAL_COMMUNITY): Payer: Self-pay | Admitting: Obstetrics and Gynecology

## 2021-09-10 ENCOUNTER — Inpatient Hospital Stay (HOSPITAL_COMMUNITY)
Admission: AD | Admit: 2021-09-10 | Discharge: 2021-09-13 | DRG: 806 | Disposition: A | Discharge status: Home / Self Care | Payer: Medicaid Other | Attending: Obstetrics and Gynecology | Admitting: Obstetrics and Gynecology

## 2021-09-10 DIAGNOSIS — O368190 Decreased fetal movements, unspecified trimester, not applicable or unspecified: Secondary | ICD-10-CM | POA: Diagnosis not present

## 2021-09-10 DIAGNOSIS — Z3A40 40 weeks gestation of pregnancy: Secondary | ICD-10-CM

## 2021-09-10 DIAGNOSIS — Z2839 Other underimmunization status: Secondary | ICD-10-CM

## 2021-09-10 DIAGNOSIS — O09899 Supervision of other high risk pregnancies, unspecified trimester: Secondary | ICD-10-CM

## 2021-09-10 DIAGNOSIS — O99354 Diseases of the nervous system complicating childbirth: Secondary | ICD-10-CM | POA: Diagnosis present

## 2021-09-10 DIAGNOSIS — Z8679 Personal history of other diseases of the circulatory system: Secondary | ICD-10-CM | POA: Diagnosis not present

## 2021-09-10 DIAGNOSIS — O165 Unspecified maternal hypertension, complicating the puerperium: Secondary | ICD-10-CM | POA: Diagnosis not present

## 2021-09-10 DIAGNOSIS — Z23 Encounter for immunization: Secondary | ICD-10-CM | POA: Diagnosis not present

## 2021-09-10 DIAGNOSIS — O480 Post-term pregnancy: Secondary | ICD-10-CM | POA: Diagnosis present

## 2021-09-10 DIAGNOSIS — Z3400 Encounter for supervision of normal first pregnancy, unspecified trimester: Secondary | ICD-10-CM

## 2021-09-10 DIAGNOSIS — G44209 Tension-type headache, unspecified, not intractable: Secondary | ICD-10-CM | POA: Diagnosis present

## 2021-09-10 DIAGNOSIS — Z3403 Encounter for supervision of normal first pregnancy, third trimester: Principal | ICD-10-CM

## 2021-09-10 DIAGNOSIS — Z148 Genetic carrier of other disease: Secondary | ICD-10-CM

## 2021-09-10 DIAGNOSIS — O368130 Decreased fetal movements, third trimester, not applicable or unspecified: Secondary | ICD-10-CM | POA: Diagnosis present

## 2021-09-10 LAB — TYPE AND SCREEN
ABO/RH(D): O POS
Antibody Screen: NEGATIVE

## 2021-09-10 LAB — CBC
HCT: 34.5 % — ABNORMAL LOW (ref 36.0–46.0)
Hemoglobin: 11.6 g/dL — ABNORMAL LOW (ref 12.0–15.0)
MCH: 28 pg (ref 26.0–34.0)
MCHC: 33.6 g/dL (ref 30.0–36.0)
MCV: 83.1 fL (ref 80.0–100.0)
Platelets: 255 10*3/uL (ref 150–400)
RBC: 4.15 MIL/uL (ref 3.87–5.11)
RDW: 15.3 % (ref 11.5–15.5)
WBC: 7.1 10*3/uL (ref 4.0–10.5)
nRBC: 0 % (ref 0.0–0.2)

## 2021-09-10 LAB — POCT FERN TEST: POCT Fern Test: NEGATIVE

## 2021-09-10 MED ORDER — OXYCODONE-ACETAMINOPHEN 5-325 MG PO TABS: 2.0000 | ORAL_TABLET | ORAL | Status: DC | PRN

## 2021-09-10 MED ORDER — MISOPROSTOL 50MCG HALF TABLET
50.0000 ug | ORAL_TABLET | ORAL | Status: DC | PRN
  Administered 2021-09-10 – 2021-09-11 (×2): 50 ug via ORAL
  Filled 2021-09-10 (×2): qty 1

## 2021-09-10 MED ORDER — LACTATED RINGERS IV SOLN: 500.0000 mL | INTRAVENOUS | Status: DC | PRN

## 2021-09-10 MED ORDER — OXYTOCIN-SODIUM CHLORIDE 30-0.9 UT/500ML-% IV SOLN
2.5000 [IU]/h | INTRAVENOUS | Status: DC
  Filled 2021-09-10: qty 500

## 2021-09-10 MED ORDER — TERBUTALINE SULFATE 1 MG/ML IJ SOLN: 0.2500 mg | Freq: Once | INTRAMUSCULAR | Status: DC | PRN

## 2021-09-10 MED ORDER — OXYTOCIN BOLUS FROM INFUSION: 333.0000 mL | Freq: Once | INTRAVENOUS | Status: DC

## 2021-09-10 MED ORDER — ONDANSETRON HCL 4 MG/2ML IJ SOLN: 4.0000 mg | Freq: Four times a day (QID) | INTRAMUSCULAR | Status: DC | PRN

## 2021-09-10 MED ORDER — ACETAMINOPHEN 325 MG PO TABS: 650.0000 mg | ORAL_TABLET | ORAL | Status: DC | PRN

## 2021-09-10 MED ORDER — LACTATED RINGERS IV SOLN
INTRAVENOUS | Status: DC
  Administered 2021-09-11: 960 mL via INTRAVENOUS

## 2021-09-10 MED ORDER — OXYCODONE-ACETAMINOPHEN 5-325 MG PO TABS: 1.0000 | ORAL_TABLET | ORAL | Status: DC | PRN

## 2021-09-10 MED ORDER — SOD CITRATE-CITRIC ACID 500-334 MG/5ML PO SOLN: 30.0000 mL | ORAL | Status: DC | PRN

## 2021-09-10 MED ORDER — FENTANYL CITRATE (PF) 100 MCG/2ML IJ SOLN
50.0000 ug | INTRAMUSCULAR | Status: DC | PRN
  Administered 2021-09-11: 100 ug via INTRAVENOUS
  Filled 2021-09-10: qty 2

## 2021-09-10 MED ORDER — LIDOCAINE HCL (PF) 1 % IJ SOLN: 30.0000 mL | INTRAMUSCULAR | Status: DC | PRN

## 2021-09-10 NOTE — H&P (Addendum)
OBSTETRIC ADMISSION HISTORY AND PHYSICAL  Theresa Lane is a 20 y.o. female G1P0 with IUP at [redacted]w[redacted]d by LMP presenting for IOL for DFM. She reports no FMs, No LOF, no VB, no blurry vision, headaches or peripheral edema, and RUQ pain.  She plans on breast feeding. She requests POP for birth control. She received her prenatal care at [facility]   Dating: By LMP --->  Estimated Date of Delivery: 09/10/21  Sono:    @[redacted]w[redacted]d , CWD, normal anatomy, cephalic presentation,  262g, EFW   Prenatal History/Complications: None  Past Medical History: Past Medical History:  Diagnosis Date   Anemia    Medical history non-contributory     Past Surgical History: Past Surgical History:  Procedure Laterality Date   NO PAST SURGERIES      Obstetrical History: OB History     Gravida  1   Para      Term      Preterm      AB      Living         SAB      IAB      Ectopic      Multiple      Live Births  0           Social History Social History   Socioeconomic History   Marital status: Single    Spouse name: Not on file   Number of children: Not on file   Years of education: Not on file   Highest education level: Not on file  Occupational History   Not on file  Tobacco Use   Smoking status: Never   Smokeless tobacco: Never  Vaping Use   Vaping Use: Former  Substance and Sexual Activity   Alcohol use: Never   Drug use: Never   Sexual activity: Yes    Birth control/protection: None  Other Topics Concern   Not on file  Social History Narrative   Not on file   Social Determinants of Health   Financial Resource Strain: Low Risk  (06/06/2021)   Overall Financial Resource Strain (CARDIA)    Difficulty of Paying Living Expenses: Not hard at all  Food Insecurity: No Food Insecurity (06/06/2021)   Hunger Vital Sign    Worried About Running Out of Food in the Last Year: Never true    Ran Out of Food in the Last Year: Never true  Transportation Needs: No  Transportation Needs (06/06/2021)   PRAPARE - 06/08/2021 (Medical): No    Lack of Transportation (Non-Medical): No  Physical Activity: Insufficiently Active (06/06/2021)   Exercise Vital Sign    Days of Exercise per Week: 1 day    Minutes of Exercise per Session: 10 min  Stress: No Stress Concern Present (06/06/2021)   06/08/2021 of Occupational Health - Occupational Stress Questionnaire    Feeling of Stress : Only a little  Social Connections: Unknown (06/06/2021)   Social Connection and Isolation Panel [NHANES]    Frequency of Communication with Friends and Family: More than three times a week    Frequency of Social Gatherings with Friends and Family: Three times a week    Attends Religious Services: Never    Active Member of Clubs or Organizations: No    Attends 06/08/2021 Meetings: Never    Marital Status: Patient refused  Recent Concern: Social Connections - Socially Isolated (03/08/2021)   Social Connection and Isolation Panel [NHANES]    Frequency  of Communication with Friends and Family: More than three times a week    Frequency of Social Gatherings with Friends and Family: Once a week    Attends Religious Services: Never    Database administrator or Organizations: No    Attends Engineer, structural: Never    Marital Status: Never married    Family History: Family History  Problem Relation Age of Onset   Asthma Mother    Healthy Father     Allergies: No Known Allergies  Pt denies allergies to latex, iodine, or shellfish.  Medications Prior to Admission  Medication Sig Dispense Refill Last Dose   acetaminophen (TYLENOL) 500 MG tablet Take 500 mg by mouth daily as needed for mild pain or fever.      Blood Pressure Monitor MISC For regular home bp monitoring during pregnancy (Patient not taking: Reported on 08/02/2021) 1 each 0    Doxylamine-Pyridoxine (DICLEGIS) 10-10 MG TBEC 2 tabs q hs, if sx persist add 1 tab q  am on day 3, if sx persist add 1 tab q afternoon on day 4 100 tablet 6    ferrous sulfate 325 (65 FE) MG tablet Take 1 tablet (325 mg total) by mouth every other day. (Patient not taking: Reported on 07/04/2021) 30 tablet 1    FOLIC ACID PO Take by mouth. (Patient not taking: Reported on 07/04/2021)      Prenatal Vit-Fe Fumarate-FA (PNV PRENATAL PLUS MULTIVITAMIN) 27-1 MG TABS Take 1 tablet by mouth daily. 90 tablet 4      Review of Systems   All systems reviewed and negative except as stated in HPI  Blood pressure 133/76, pulse 98, temperature 98.5 F (36.9 C), temperature source Oral, resp. rate 18, height 4\' 10"  (1.473 m), weight 166 lb 6.4 oz (75.5 kg), last menstrual period 12/04/2020. General appearance: alert, cooperative, appears stated age, and no distress Lungs: clear to auscultation bilaterally Heart: regular rate and rhythm Abdomen: soft, non-tender; bowel sounds normal Extremities: Homans sign is negative, no sign of DVT Presentation: cephalic Fetal monitoringBaseline: 135 bpm, Variability: Good {> 6 bpm), Accelerations: Reactive, and Decelerations: Absent Uterine activity: irregular Dilation: 1.5 Effacement (%): 60 Station: -3 Exam by:: Dr. [email or web address]   Prenatal labs: ABO, Rh: --/--/O POS (06/26 1854) Antibody: NEG (06/26 1854) Rubella: <0.90 (12/22 1106) RPR: Non Reactive (03/22 0808)  HBsAg: Negative (12/22 1106)  HIV: Non Reactive (03/22 0808)  GBS: Negative/-- (06/01 1450)  1 hr Glucola: Normal Genetic screening:  SMA carrier Anatomy [birth year]1987: Normal  Prenatal Transfer Tool  Maternal Diabetes: No Genetic Screening: Abnormal:  Results: Other: SMA carrier Maternal Ultrasounds/Referrals: Normal Fetal Ultrasounds or other Referrals:  None Maternal Substance Abuse:  No Significant Maternal Medications:  None Significant Maternal Lab Results: Group B Strep negative  Results for orders placed or performed during the hospital encounter of 09/10/21 (from the past 24 hour(s))   Fern Test   Collection Time: 09/10/21  6:30 PM  Result Value Ref Range   POCT Fern Test Negative = intact amniotic membranes   CBC   Collection Time: 09/10/21  6:54 PM  Result Value Ref Range   WBC 7.1 4.0 - 10.5 K/uL   RBC 4.15 3.87 - 5.11 MIL/uL   Hemoglobin 11.6 (L) 12.0 - 15.0 g/dL   HCT 09/12/21 (L) 76.1 - 95.0 %   MCV 83.1 80.0 - 100.0 fL   MCH 28.0 26.0 - 34.0 pg   MCHC 33.6 30.0 - 36.0 g/dL   RDW 93.2 67.1 -  15.5 %   Platelets 255 150 - 400 K/uL   nRBC 0.0 0.0 - 0.2 %  Type and screen [facility]   Collection Time: 09/10/21  6:54 PM  Result Value Ref Range   ABO/RH(D) O POS    Antibody Screen NEG    Sample Expiration      09/13/2021,2359 Performed at [facility], [address], [place], [place] [address]     Patient Active Problem List   Diagnosis Date Noted   Decreased fetal movement 09/10/2021   Carrier of spinal muscular atrophy 05/09/2021   Rubella non-immune status, antepartum 03/09/2021   Supervision of normal first pregnancy 03/07/2021    Assessment/Plan:  Shamela Haydon is a 21 y.o. G1P0 at [redacted]w[redacted]d here for IOL for DFM  #Labor:Foley balloon placed during current check, will also give cytotec 50 mcg at this time. Discussed all other IOL methods and patient amenable if and when warranted.  #Pain: Epidural #FWB: Category I #ID:  GBS neg #MOF: Breast  #MOC: POP #Circ:  Yes  Bess Kinds, PGY1 Center for [employer], [facility]   GME ATTESTATION:  I saw and evaluated the patient. I agree with the findings and the plan of care as documented in the resident's note and have made all necessary edits.  Warner Mccreedy, MD, MPH OB Fellow, Faculty Practice [facility], Center for [facility] 09/10/2021 11:30 PM

## 2021-09-10 NOTE — MAU Note (Addendum)
...  Theresa Lane is a 21 y.o. at [redacted]w[redacted]d here in MAU reporting: Intermittent lower abdominal cramping and vaginal pressure every other hour that has been ongoing for the past two days. She reports she also intermittently feels like she has to push something out of her bottom and her vagina. Denies VB but reports she has been leaking fluid around five times an hour for the past two days as well. DFM since 1200 today. Has not felt any movement since then.   Also endorses a HA for the past two days. Has not taken anything for the pain.  Pain score:  5/10 lower abdomen 5/10 HA  FHT: 143 initial external Lab orders placed from triage:  Crist Fat

## 2021-09-10 NOTE — Progress Notes (Deleted)
OBSTETRIC ADMISSION HISTORY AND PHYSICAL  Theresa Lane is a 21 y.o. female G1P0 with IUP at [redacted]w[redacted]d by LMP presenting for IOL for DFM. She reports decreased Fms, No LOF, no VB, no blurry vision, headaches or peripheral edema, and RUQ pain.  She plans on breast feeding. She requests POP's for birth control. She received her prenatal care at [facility]   Dating: By LMP --->  Estimated Date of Delivery: 09/10/21    Sono:    @[redacted]w[redacted]d , CWD, normal anatomy, cephalic presentation,  262g, 30% EFW   Prenatal History/Complications: None  Past Medical History: Past Medical History:  Diagnosis Date   Anemia    Medical history non-contributory     Past Surgical History: Past Surgical History:  Procedure Laterality Date   NO PAST SURGERIES      Obstetrical History: OB History     Gravida  1   Para      Term      Preterm      AB      Living         SAB      IAB      Ectopic      Multiple      Live Births  0           Social History Social History   Socioeconomic History   Marital status: Single    Spouse name: Not on file   Number of children: Not on file   Years of education: Not on file   Highest education level: Not on file  Occupational History   Not on file  Tobacco Use   Smoking status: Never   Smokeless tobacco: Never  Vaping Use   Vaping Use: Former  Substance and Sexual Activity   Alcohol use: Never   Drug use: Never   Sexual activity: Yes    Birth control/protection: None  Other Topics Concern   Not on file  Social History Narrative   Not on file   Social Determinants of Health   Financial Resource Strain: Low Risk  (06/06/2021)   Overall Financial Resource Strain (CARDIA)    Difficulty of Paying Living Expenses: Not hard at all  Food Insecurity: No Food Insecurity (06/06/2021)   Hunger Vital Sign    Worried About Running Out of Food in the Last Year: Never true    Ran Out of Food in the Last Year: Never true  Transportation  Needs: No Transportation Needs (06/06/2021)   PRAPARE - Administrator, Civil Service (Medical): No    Lack of Transportation (Non-Medical): No  Physical Activity: Insufficiently Active (06/06/2021)   Exercise Vital Sign    Days of Exercise per Week: 1 day    Minutes of Exercise per Session: 10 min  Stress: No Stress Concern Present (06/06/2021)   [employer] of Occupational Health - Occupational Stress Questionnaire    Feeling of Stress : Only a little  Social Connections: Unknown (06/06/2021)   Social Connection and Isolation Panel [NHANES]    Frequency of Communication with Friends and Family: More than three times a week    Frequency of Social Gatherings with Friends and Family: Three times a week    Attends Religious Services: Never    Active Member of Clubs or Organizations: No    Attends Banker Meetings: Never    Marital Status: Patient refused  Recent Concern: Social Connections - Socially Isolated (03/08/2021)   Social Connection and Isolation Panel [NHANES]  Frequency of Communication with Friends and Family: More than three times a week    Frequency of Social Gatherings with Friends and Family: Once a week    Attends Religious Services: Never    Database administrator or Organizations: No    Attends Engineer, structural: Never    Marital Status: Never married    Family History: Family History  Problem Relation Age of Onset   Asthma Mother    Healthy Father     Allergies: No Known Allergies  Pt denies allergies to latex, iodine, or shellfish.  Medications Prior to Admission  Medication Sig Dispense Refill Last Dose   acetaminophen (TYLENOL) 500 MG tablet Take 500 mg by mouth daily as needed for mild pain or fever.      Blood Pressure Monitor MISC For regular home bp monitoring during pregnancy (Patient not taking: Reported on 08/02/2021) 1 each 0    Doxylamine-Pyridoxine (DICLEGIS) 10-10 MG TBEC 2 tabs q hs, if sx persist add  1 tab q am on day 3, if sx persist add 1 tab q afternoon on day 4 100 tablet 6    ferrous sulfate 325 (65 FE) MG tablet Take 1 tablet (325 mg total) by mouth every other day. (Patient not taking: Reported on 07/04/2021) 30 tablet 1    FOLIC ACID PO Take by mouth. (Patient not taking: Reported on 07/04/2021)      Prenatal Vit-Fe Fumarate-FA (PNV PRENATAL PLUS MULTIVITAMIN) 27-1 MG TABS Take 1 tablet by mouth daily. 90 tablet 4      Review of Systems   All systems reviewed and negative except as stated in HPI  Blood pressure 130/88, pulse 99, temperature 98.5 F (36.9 C), temperature source Oral, resp. rate 16, height 4\' 10"  (1.473 m), weight 75.5 kg, last menstrual period 12/04/2020. General appearance: alert, cooperative, and appears stated age Lungs: clear to auscultation bilaterally Heart: regular rate and rhythm Abdomen: soft, non-tender Extremities: Homans sign is negative, no sign of DVT Presentation: cephalic Fetal monitoringBaseline: 135 bpm, Variability: Good {> 6 bpm), Accelerations: Reactive, and Decelerations: Absent Uterine activity Irregular Dilation: 1.5 Effacement (%): 60 Station: -3 Exam by:: Dr. Ephriam Jenkins   Prenatal labs: ABO, Rh: --/--/O POS (06/26 1854) Antibody: NEG (06/26 1854) Rubella: <0.90 (12/22 1106) RPR: Non Reactive (03/22 0808)  HBsAg: Negative (12/22 1106)  HIV: Non Reactive (03/22 0808)  GBS: Negative/-- (06/01 1450)  1 hr Glucola: normal Genetic screening:  SMA carrier Anatomy US: Normal  Prenatal Transfer Tool  Maternal Diabetes: No Genetic Screening: Abnormal:  Results: Other:Carrier for SMA Maternal Ultrasounds/Referrals: Normal Fetal Ultrasounds or other Referrals:  None Maternal Substance Abuse:  No Significant Maternal Medications:  None Significant Maternal Lab Results: Group B Strep negative  Results for orders placed or performed during the hospital encounter of 09/10/21 (from the past 24 hour(s))  Fern Test   Collection Time:  09/10/21  6:30 PM  Result Value Ref Range   POCT Fern Test Negative = intact amniotic membranes   CBC   Collection Time: 09/10/21  6:54 PM  Result Value Ref Range   WBC 7.1 4.0 - 10.5 K/uL   RBC 4.15 3.87 - 5.11 MIL/uL   Hemoglobin 11.6 (L) 12.0 - 15.0 g/dL   HCT 40.3 (L) 47.4 - 25.9 %   MCV 83.1 80.0 - 100.0 fL   MCH 28.0 26.0 - 34.0 pg   MCHC 33.6 30.0 - 36.0 g/dL   RDW 56.3 87.5 - 64.3 %   Platelets 255 150 -  400 K/uL   nRBC 0.0 0.0 - 0.2 %  Type and screen [facility]   Collection Time: 09/10/21  6:54 PM  Result Value Ref Range   ABO/RH(D) O POS    Antibody Screen NEG    Sample Expiration      09/13/2021,2359 Performed at [facility], [address], [place], [place] [address]     Patient Active Problem List   Diagnosis Date Noted   Decreased fetal movement 09/10/2021   Carrier of spinal muscular atrophy 05/09/2021   Rubella non-immune status, antepartum 03/09/2021   Supervision of normal first pregnancy 03/07/2021    Assessment/Plan:  Theresa Lane is a 21 y.o. G1P0 at [redacted]w[redacted]d here for IOL for DFM  #Labor: Foley balloon placed, will also give cytotec 50. Expectant management #Pain: Epidural #FWB: Category I #ID:  GBS neg #MOF: Breast  #MOC: POP #Circ:  Yes  Bess Kinds, MD  Center for [facility], [facility] 09/10/2021, 10:07 PM

## 2021-09-10 NOTE — MAU Note (Signed)
Admission labs sent;

## 2021-09-10 NOTE — MAU Provider Note (Signed)
History     CSN: [id number]  Arrival date and time: 09/10/21 1724   None     Chief Complaint  Patient presents with   Decreased Fetal Movement   vaginal pressure   Contractions   Theresa Lane is a 21 yo female G1P0 at [redacted] weeks gestation presenting for evaluation of pelvic pressure, intermittent contractions, decreased fetal movement, and leakage of discharge.   DFM: Reports less fetal movement over the past several days. Today felt no movement until MAU arrival. Still less than usual.   Intermittent contractions every hour or so. Feels a pressure sensation during that time. No current contractions or pressure urge.   LOF: Feels like she has had some vaginal discharge and a few drops of urine come out earlier. Has not had continuous leakage.    Past Medical History:  Diagnosis Date   Anemia    Medical history non-contributory     Past Surgical History:  Procedure Laterality Date   NO PAST SURGERIES      Family History  Problem Relation Age of Onset   Asthma Mother    Healthy Father     Social History   Tobacco Use   Smoking status: Never   Smokeless tobacco: Never  Vaping Use   Vaping Use: Former  Substance Use Topics   Alcohol use: Never   Drug use: Never    Allergies: No Known Allergies  Medications Prior to Admission  Medication Sig Dispense Refill Last Dose   acetaminophen (TYLENOL) 500 MG tablet Take 500 mg by mouth daily as needed for mild pain or fever.      Blood Pressure Monitor MISC For regular home bp monitoring during pregnancy (Patient not taking: Reported on 08/02/2021) 1 each 0    Doxylamine-Pyridoxine (DICLEGIS) 10-10 MG TBEC 2 tabs q hs, if sx persist add 1 tab q am on day 3, if sx persist add 1 tab q afternoon on day 4 100 tablet 6    ferrous sulfate 325 (65 FE) MG tablet Take 1 tablet (325 mg total) by mouth every other day. (Patient not taking: Reported on 07/04/2021) 30 tablet 1    FOLIC ACID PO Take by mouth. (Patient not taking: Reported on  07/04/2021)      Prenatal Vit-Fe Fumarate-FA (PNV PRENATAL PLUS MULTIVITAMIN) 27-1 MG TABS Take 1 tablet by mouth daily. 90 tablet 4     Review of Systems  Constitutional:  Positive for fever. Negative for fatigue.  Respiratory:  Negative for chest tightness and shortness of breath.   Cardiovascular:  Negative for chest pain.  Gastrointestinal:  Negative for abdominal pain, constipation, diarrhea, nausea and vomiting.  Genitourinary:  Positive for vaginal discharge. Negative for dysuria, flank pain and vaginal bleeding.  Musculoskeletal:  Negative for back pain.  Skin:  Negative for color change.  Neurological:  Positive for headaches. Negative for dizziness, syncope, weakness, light-headedness and numbness.       Reports mild "tension" like headache, describes as a band across her forehead   Psychiatric/Behavioral:  Negative for behavioral problems.    Physical Exam   Blood pressure 124/78, pulse 92, height 4\' 10"  (1.473 m), weight 75.5 kg, last menstrual period 12/04/2020.  Physical Exam Constitutional:      General: She is not in acute distress.    Appearance: Normal appearance. She is not ill-appearing.  HENT:     Head: Normocephalic and atraumatic.     Nose: No congestion.     Mouth/Throat:     Mouth: Mucous membranes  are moist.  Eyes:     Extraocular Movements: Extraocular movements intact.  Cardiovascular:     Rate and Rhythm: Normal rate.  Pulmonary:     Effort: Pulmonary effort is normal.  Abdominal:     General: There is no distension.     Palpations: Abdomen is soft.  Genitourinary:    Comments: Pelvic exam: VULVA: normal appearing vulva with no masses, tenderness or lesions, VAGINA: normal appearing vagina with normal color, no lesions, small amount of white discharge. No fluid pooling and no fluid coming from cervix with coughing. CERVIX: normal appearing cervix without discharge or lesions  Skin:    General: Skin is warm.     Capillary Refill: Capillary refill  takes less than 2 seconds.  Neurological:     Mental Status: She is alert and oriented to person, place, and time.  Psychiatric:        Behavior: Behavior normal.   Dilation: 1 Effacement (%): 50 Station: -2 Presentation: Vertex Exam by:: Isabel Caprice, RN   NST: REACTIVE  Baseline 130  Mod Var  15x15 accels  No decels No ctx on toco   MAU Course   MDM Speculum exam without evidence of ROM Fern negative  NST: reactive   Assessment and Plan   Decreased fetal movement Persistent for several days, none today until MAU arrival. Reactive NST. Admit to L&D for IOL which patient is agreeable for.  [redacted] weeks gestation  Tension headache  Performed occipital release and cervical soft tissue. Placed cool washcloth on forehead, can get tylenol on L&D. BP WNL, no c/f preeclampsia at this time.  Evaluation for ROM, ROM not found  Speculum and fern negative.   Admit to L&D for IOL d/t DFM.     Allayne Stack 09/10/2021, 7:37 PM

## 2021-09-11 ENCOUNTER — Inpatient Hospital Stay (HOSPITAL_COMMUNITY): Payer: Medicaid Other | Admitting: Anesthesiology

## 2021-09-11 ENCOUNTER — Encounter (HOSPITAL_COMMUNITY): Payer: Self-pay | Admitting: Obstetrics and Gynecology

## 2021-09-11 DIAGNOSIS — O480 Post-term pregnancy: Secondary | ICD-10-CM | POA: Diagnosis present

## 2021-09-11 DIAGNOSIS — O368190 Decreased fetal movements, unspecified trimester, not applicable or unspecified: Secondary | ICD-10-CM

## 2021-09-11 DIAGNOSIS — Z3A40 40 weeks gestation of pregnancy: Secondary | ICD-10-CM

## 2021-09-11 LAB — RPR: RPR Ser Ql: NONREACTIVE

## 2021-09-11 MED ORDER — LACTATED RINGERS IV SOLN: 500.0000 mL | INTRAVENOUS | Status: DC | PRN

## 2021-09-11 MED ORDER — DIPHENHYDRAMINE HCL 50 MG/ML IJ SOLN: 12.5000 mg | INTRAMUSCULAR | Status: DC | PRN

## 2021-09-11 MED ORDER — WITCH HAZEL-GLYCERIN EX PADS: 1.0000 | MEDICATED_PAD | CUTANEOUS | Status: DC | PRN

## 2021-09-11 MED ORDER — ACETAMINOPHEN 325 MG PO TABS
650.0000 mg | ORAL_TABLET | ORAL | Status: DC | PRN
  Filled 2021-09-11: qty 2

## 2021-09-11 MED ORDER — BENZOCAINE-MENTHOL 20-0.5 % EX AERO
1.0000 | INHALATION_SPRAY | CUTANEOUS | Status: DC | PRN
  Administered 2021-09-13: 1 via TOPICAL
  Filled 2021-09-11: qty 56

## 2021-09-11 MED ORDER — EPHEDRINE 5 MG/ML INJ: 10.0000 mg | INTRAVENOUS | Status: DC | PRN

## 2021-09-11 MED ORDER — OXYCODONE-ACETAMINOPHEN 5-325 MG PO TABS: 2.0000 | ORAL_TABLET | ORAL | Status: DC | PRN

## 2021-09-11 MED ORDER — PRENATAL MULTIVITAMIN CH
1.0000 | ORAL_TABLET | Freq: Every day | ORAL | Status: DC
  Administered 2021-09-12 – 2021-09-13 (×2): 1 via ORAL
  Filled 2021-09-11 (×2): qty 1

## 2021-09-11 MED ORDER — LACTATED RINGERS IV SOLN: INTRAVENOUS | Status: DC

## 2021-09-11 MED ORDER — SENNOSIDES-DOCUSATE SODIUM 8.6-50 MG PO TABS
2.0000 | ORAL_TABLET | Freq: Every day | ORAL | Status: DC
Start: 2021-09-12 — End: 2021-09-13
  Administered 2021-09-12 – 2021-09-13 (×2): 2 via ORAL
  Filled 2021-09-11 (×2): qty 2

## 2021-09-11 MED ORDER — ONDANSETRON HCL 4 MG/2ML IJ SOLN: 4.0000 mg | INTRAMUSCULAR | Status: DC | PRN

## 2021-09-11 MED ORDER — SOD CITRATE-CITRIC ACID 500-334 MG/5ML PO SOLN: 30.0000 mL | ORAL | Status: DC | PRN

## 2021-09-11 MED ORDER — FENTANYL-BUPIVACAINE-NACL 0.5-0.125-0.9 MG/250ML-% EP SOLN
12.0000 mL/h | EPIDURAL | Status: DC | PRN
  Administered 2021-09-11: 12 mL/h via EPIDURAL
  Filled 2021-09-11: qty 250

## 2021-09-11 MED ORDER — IBUPROFEN 600 MG PO TABS
600.0000 mg | ORAL_TABLET | Freq: Four times a day (QID) | ORAL | Status: DC
  Administered 2021-09-11 – 2021-09-13 (×8): 600 mg via ORAL
  Filled 2021-09-11 (×8): qty 1

## 2021-09-11 MED ORDER — LIDOCAINE HCL (PF) 1 % IJ SOLN
INTRAMUSCULAR | Status: DC | PRN
  Administered 2021-09-11: 5 mL via EPIDURAL

## 2021-09-11 MED ORDER — OXYTOCIN BOLUS FROM INFUSION: 333.0000 mL | Freq: Once | INTRAVENOUS | Status: DC

## 2021-09-11 MED ORDER — MEDROXYPROGESTERONE ACETATE 150 MG/ML IM SUSP: 150.0000 mg | INTRAMUSCULAR | Status: DC | PRN

## 2021-09-11 MED ORDER — OXYCODONE-ACETAMINOPHEN 5-325 MG PO TABS: 1.0000 | ORAL_TABLET | ORAL | Status: DC | PRN

## 2021-09-11 MED ORDER — PHENYLEPHRINE 80 MCG/ML (10ML) SYRINGE FOR IV PUSH (FOR BLOOD PRESSURE SUPPORT): 80.0000 ug | PREFILLED_SYRINGE | INTRAVENOUS | Status: DC | PRN

## 2021-09-11 MED ORDER — ACETAMINOPHEN 325 MG PO TABS
650.0000 mg | ORAL_TABLET | ORAL | Status: DC | PRN
  Administered 2021-09-12: 650 mg via ORAL

## 2021-09-11 MED ORDER — SIMETHICONE 80 MG PO CHEW: 80.0000 mg | CHEWABLE_TABLET | ORAL | Status: DC | PRN

## 2021-09-11 MED ORDER — LACTATED RINGERS IV SOLN: 500.0000 mL | Freq: Once | INTRAVENOUS | Status: DC

## 2021-09-11 MED ORDER — ONDANSETRON HCL 4 MG/2ML IJ SOLN: 4.0000 mg | Freq: Four times a day (QID) | INTRAMUSCULAR | Status: DC | PRN

## 2021-09-11 MED ORDER — FENTANYL CITRATE (PF) 100 MCG/2ML IJ SOLN: 50.0000 ug | INTRAMUSCULAR | Status: DC | PRN

## 2021-09-11 MED ORDER — PHENYLEPHRINE 80 MCG/ML (10ML) SYRINGE FOR IV PUSH (FOR BLOOD PRESSURE SUPPORT)
80.0000 ug | PREFILLED_SYRINGE | INTRAVENOUS | Status: DC | PRN
Start: 2021-09-11 — End: 2021-09-11

## 2021-09-11 MED ORDER — OXYTOCIN-SODIUM CHLORIDE 30-0.9 UT/500ML-% IV SOLN: 2.5000 [IU]/h | INTRAVENOUS | Status: DC

## 2021-09-11 MED ORDER — COCONUT OIL OIL
1.0000 | TOPICAL_OIL | Status: DC | PRN
  Administered 2021-09-12: 1 via TOPICAL

## 2021-09-11 MED ORDER — LIDOCAINE HCL (PF) 1 % IJ SOLN
30.0000 mL | INTRAMUSCULAR | Status: DC | PRN
Start: 2021-09-11 — End: 2021-09-13

## 2021-09-11 MED ORDER — MEASLES, MUMPS & RUBELLA VAC IJ SOLR
0.5000 mL | Freq: Once | INTRAMUSCULAR | Status: AC
  Administered 2021-09-13: 0.5 mL via SUBCUTANEOUS
  Filled 2021-09-11: qty 0.5

## 2021-09-11 MED ORDER — DIPHENHYDRAMINE HCL 25 MG PO CAPS: 25.0000 mg | ORAL_CAPSULE | Freq: Four times a day (QID) | ORAL | Status: DC | PRN

## 2021-09-11 MED ORDER — OXYTOCIN-SODIUM CHLORIDE 30-0.9 UT/500ML-% IV SOLN
1.0000 m[IU]/min | INTRAVENOUS | Status: DC
  Administered 2021-09-11: 2 m[IU]/min via INTRAVENOUS

## 2021-09-11 MED ORDER — ONDANSETRON HCL 4 MG PO TABS: 4.0000 mg | ORAL_TABLET | ORAL | Status: DC | PRN

## 2021-09-11 MED ORDER — LIDOCAINE-EPINEPHRINE (PF) 1.5 %-1:200000 IJ SOLN
INTRAMUSCULAR | Status: DC | PRN
  Administered 2021-09-11: 5 mL via EPIDURAL

## 2021-09-11 MED ORDER — TETANUS-DIPHTH-ACELL PERTUSSIS 5-2.5-18.5 LF-MCG/0.5 IM SUSY: 0.5000 mL | PREFILLED_SYRINGE | Freq: Once | INTRAMUSCULAR | Status: DC

## 2021-09-11 MED ORDER — DIBUCAINE (PERIANAL) 1 % EX OINT: 1.0000 | TOPICAL_OINTMENT | CUTANEOUS | Status: DC | PRN

## 2021-09-11 NOTE — Discharge Summary (Addendum)
Postpartum Discharge Summary      Patient Name: Theresa Lane DOB: December 09, 2000 MRN: [id number]  Date of admission: 09/10/2021 Delivery date:09/11/2021  Delivering provider: Patriciaann Clan  Date of discharge: 09/13/2021  Admitting diagnosis: Decreased fetal movement [O36.8190] Post-dates pregnancy [O48.0] Intrauterine pregnancy: [redacted]w[redacted]d    Secondary diagnosis:  Principal Problem:   Decreased fetal movement Active Problems:   Supervision of normal first pregnancy   Rubella non-immune status, antepartum   Carrier of spinal muscular atrophy   Post-dates pregnancy  Additional problems: Postpartum Hypertension    Discharge diagnosis: Term Pregnancy Delivered                                              Post partum procedures: None Augmentation: AROM, Pitocin, Cytotec, and IP Foley Complications: None  Hospital course: Induction of Labor With Vaginal Delivery   21y.o. yo G1P1001 at 417w1das admitted to the hospital 09/10/2021 for induction of labor.  Indication for induction:  DFM .  Patient had an uncomplicated labor course as follows: Membrane Rupture Time/Date: 7:49 AM ,09/11/2021   Delivery Method:Vaginal, Spontaneous  Episiotomy: None  Lacerations:  Periurethral  Details of delivery can be found in separate delivery note.  Patient had a postpartum course remarkable for being started on Procardia and Lasix on PPD#1 due to mod range BP elevations. Patient is discharged home 09/13/21.  Newborn Data: Birth date:09/11/2021  Birth time:1:43 PM  Gender:Female  Living status:Living  Apgars:9 ,9  Weight:3640 g (8lb 0.4oz)  Magnesium Sulfate received: No BMZ received: No Rhophylac:N/A MMR:Yes T-DaP:Given prenatally Flu: No Transfusion:No  Physical exam  Vitals:   09/12/21 0528 09/12/21 1343 09/12/21 2052 09/13/21 0546  BP: 118/78 135/85 107/66 127/81  Pulse: 88 (!) 104 90 81  Resp: 18 17 16 16   Temp: 98.5 F (36.9 C) 98.5 F (36.9 C) 98.5 F (36.9 C) 98.1 F  (36.7 C)  TempSrc: Oral Oral Oral Oral  SpO2: 99% 100%    Weight:      Height:       General: alert, cooperative, and no distress Lochia: appropriate Uterine Fundus: firm Incision: N/A DVT Evaluation: No evidence of DVT seen on physical exam. No cords or calf tenderness. No significant calf/ankle edema. Labs: Lab Results  Component Value Date   WBC 11.8 (H) 09/12/2021   HGB 9.4 (L) 09/12/2021   HCT 27.5 (L) 09/12/2021   MCV 82.6 09/12/2021   PLT 190 09/12/2021      Latest Ref Rng & Units 01/23/2021    2:41 PM  CMP  Glucose 70 - 99 mg/dL 87   BUN 6 - 20 mg/dL 16   Creatinine 0.44 - 1.00 mg/dL 0.72   Sodium 135 - 145 mmol/L 135   Potassium 3.5 - 5.1 mmol/L 4.0   Chloride 98 - 111 mmol/L 106   CO2 22 - 32 mmol/L 21   Calcium 8.9 - 10.3 mg/dL 9.0   Total Protein 6.5 - 8.1 g/dL 6.6   Total Bilirubin 0.3 - 1.2 mg/dL 0.5   Alkaline Phos 38 - 126 U/L 47   AST 15 - 41 U/L 13   ALT 0 - 44 U/L 12    Edinburgh Score:    09/11/2021    4:10 PM  Edinburgh Postnatal Depression Scale Screening Tool  I have been able to laugh and see the funny side  of things. 0  I have looked forward with enjoyment to things. 0  I have blamed myself unnecessarily when things went wrong. 1  I have been anxious or worried for no good reason. 1  I have felt scared or panicky for no good reason. 0  Things have been getting on top of me. 0  I have been so unhappy that I have had difficulty sleeping. 0  I have felt sad or miserable. 1  I have been so unhappy that I have been crying. 1  The thought of harming myself has occurred to me. 0  Edinburgh Postnatal Depression Scale Total 4     After visit meds:  Allergies as of 09/13/2021   No Known Allergies      Medication List     STOP taking these medications    Doxylamine-Pyridoxine 10-10 MG Tbec Commonly known as: Diclegis   ferrous sulfate 325 (65 FE) MG tablet   FOLIC ACID PO   PNV Prenatal Plus Multivitamin 27-1 MG Tabs        TAKE these medications    acetaminophen 500 MG tablet Commonly known as: TYLENOL Take 500 mg by mouth daily as needed for mild pain or fever.   Blood Pressure Monitor Misc For regular home bp monitoring during pregnancy   furosemide 20 MG tablet Commonly known as: LASIX Take 1 tablet (20 mg total) by mouth daily for 4 days.   NIFEdipine 30 MG 24 hr tablet Commonly known as: ADALAT CC Take 1 tablet (30 mg total) by mouth daily.   norethindrone 0.35 MG tablet Commonly known as: Ortho Micronor Take 1 tablet (0.35 mg total) by mouth daily. Start taking on: September 30, 2021         Discharge home in stable condition Infant Feeding: Breast Infant Disposition:home with mother Discharge instruction: per After Visit Summary and Postpartum booklet. Activity: Advance as tolerated. Pelvic rest for 6 weeks.  Diet: routine diet Future Appointments: Future Appointments  Date Time Provider [place]  09/19/2021 10:30 AM [facility] NURSE [facility]  10/23/2021  9:30 AM Roma Schanz, CNM [facility]   #High Blood Pressure Patient started on Procardia and lasix for elevated BP. Patient to continue lasix for 4 more days, and to continue taking Procardia daily. -Lasix 20 mg daily x 4 days -Procardia 30 mg daily  Follow up Visit:  Message sent to FT by Dr Higinio Plan:  Please schedule this patient for a In person postpartum visit in 6 weeks with the following provider: Any provider. Additional Postpartum F/U:BP check 1 week  Low risk pregnancy complicated by:  elevated BP shortly before delivery and postpartum  Delivery mode:  Vaginal, Spontaneous  Anticipated Birth Control:  POPs   09/13/2021 Holley Bouche, MD  CNM attestation I have seen and examined this patient and agree with above documentation in the resident's note.   Nohelia Valenza is a 21 y.o. G1P1001 s/p vag del with PP hypertension noted.   Pain is well controlled.  Plan for birth control is oral  progesterone-only contraceptive.  Method of Feeding: breast  PE:  BP 127/81 (BP Location: Right Arm)   Pulse 81   Temp 98.1 F (36.7 C) (Oral)   Resp 16   Ht 4' 10"  (1.473 m)   Wt 75.5 kg   LMP 12/04/2020 Comment: irreg cycles  SpO2 100%   Breastfeeding Unknown   BMI 34.78 kg/m  Fundus firm  Recent Labs    09/12/21 1531  HGB 9.4*  HCT  27.5*     Plan: discharge today - will send home with Procardia/Lasix - postpartum care discussed - f/u clinic in 6 weeks for BP check in 1 week and postpartum visit in 4-6 weeks   Myrtis Ser, CNM 11:24 AM

## 2021-09-11 NOTE — Discharge Summary (Incomplete Revision)
Postpartum Discharge Summary  Date of Service updated 09/13/21     Patient Name: Theresa Lane DOB: 07-Apr-2000 MRN: [id number]  Date of admission: 09/10/2021 Delivery date:09/11/2021  Delivering provider: Patriciaann Clan  Date of discharge: 09/13/2021  Admitting diagnosis: Decreased fetal movement [O36.8190] Post-dates pregnancy [O48.0] Intrauterine pregnancy: [redacted]w[redacted]d    Secondary diagnosis:  Principal Problem:   Decreased fetal movement Active Problems:   Supervision of normal first pregnancy   Rubella non-immune status, antepartum   Carrier of spinal muscular atrophy   Post-dates pregnancy  Additional problems: None    Discharge diagnosis: Term Pregnancy Delivered                                              Post partum procedures: None Augmentation: AROM, Pitocin, Cytotec, and IP Foley Complications: None  Hospital course: Induction of Labor With Vaginal Delivery   21y.o. yo G1P1001 at 464w1das admitted to the hospital 09/10/2021 for induction of labor.  Indication for induction:  DFM .  Patient had an uncomplicated labor course as follows: Membrane Rupture Time/Date: 7:49 AM ,09/11/2021   Delivery Method:Vaginal, Spontaneous  Episiotomy: None  Lacerations:  Periurethral  Details of delivery can be found in separate delivery note.  Patient had a routine postpartum course. Patient is discharged home 09/13/21.  Newborn Data: Birth date:09/11/2021  Birth time:1:43 PM  Gender:Female  Living status:Living  Apgars:9 ,9  Weight:3640 g   Magnesium Sulfate received: No BMZ received: No Rhophylac:No MMR:No T-DaP:Given prenatally Flu: No Transfusion:No  Physical exam  Vitals:   09/12/21 0528 09/12/21 1343 09/12/21 2052 09/13/21 0546  BP: 118/78 135/85 107/66 127/81  Pulse: 88 (!) 104 90 81  Resp: _0 Temp: 98.5 F (36.9 C) 98.5 F (36.9 C) 98.5 F (36.9 C) 98.1 F (36.7 C)  TempSrc: Oral Oral Oral Oral  SpO2: 99% 100%    Weight:      Height:        General: alert, cooperative, and no distress Lochia: appropriate Uterine Fundus: firm Incision: N/A DVT Evaluation: No evidence of DVT seen on physical exam. No cords or calf tenderness. No significant calf/ankle edema. Labs: Lab Results  Component Value Date   WBC 11.8 (H) 09/12/2021   HGB 9.4 (L) 09/12/2021   HCT 27.5 (L) 09/12/2021   MCV 82.6 09/12/2021   PLT 190 09/12/2021      Latest Ref Rng & Units 01/23/2021    2:41 PM  CMP  Glucose 70 - 99 mg/dL 87   BUN 6 - 20 mg/dL 16   Creatinine 0.44 - 1.00 mg/dL 0.72   Sodium 135 - 145 mmol/L 135   Potassium 3.5 - 5.1 mmol/L 4.0   Chloride 98 - 111 mmol/L 106   CO2 22 - 32 mmol/L 21   Calcium 8.9 - 10.3 mg/dL 9.0   Total Protein 6.5 - 8.1 g/dL 6.6   Total Bilirubin 0.3 - 1.2 mg/dL 0.5   Alkaline Phos 38 - 126 U/L 47   AST 15 - 41 U/L 13   ALT 0 - 44 U/L 12    Edinburgh Score:    09/11/2021    4:10 PM  Edinburgh Postnatal Depression Scale Screening Tool  I have been able to laugh and see the funny side of things. 0  I have looked forward with enjoyment to things.  0  I have blamed myself unnecessarily when things went wrong. 1  I have been anxious or worried for no good reason. 1  I have felt scared or panicky for no good reason. 0  Things have been getting on top of me. 0  I have been so unhappy that I have had difficulty sleeping. 0  I have felt sad or miserable. 1  I have been so unhappy that I have been crying. 1  The thought of harming myself has occurred to me. 0  Edinburgh Postnatal Depression Scale Total 4     After visit meds:  Allergies as of 09/13/2021   No Known Allergies      Medication List     STOP taking these medications    Doxylamine-Pyridoxine 10-10 MG Tbec Commonly known as: Diclegis   ferrous sulfate 325 (65 FE) MG tablet   FOLIC ACID PO   PNV Prenatal Plus Multivitamin 27-1 MG Tabs       TAKE these medications    acetaminophen 500 MG tablet Commonly known as:  TYLENOL Take 500 mg by mouth daily as needed for mild pain or fever.   Blood Pressure Monitor Misc For regular home bp monitoring during pregnancy   furosemide 20 MG tablet Commonly known as: LASIX Take 1 tablet (20 mg total) by mouth daily for 4 days.   NIFEdipine 30 MG 24 hr tablet Commonly known as: ADALAT CC Take 1 tablet (30 mg total) by mouth daily.   norethindrone 0.35 MG tablet Commonly known as: Ortho Micronor Take 1 tablet (0.35 mg total) by mouth daily. Start taking on: September 30, 2021         Discharge home in stable condition Infant Feeding: Breast Infant Disposition:home with mother Discharge instruction: per After Visit Summary and Postpartum booklet. Activity: Advance as tolerated. Pelvic rest for 6 weeks.  Diet: routine diet Future Appointments: Future Appointments  Date Time Provider [place]  09/19/2021 10:30 AM [facility] NURSE [facility]  10/23/2021  9:30 AM Roma Schanz, CNM [facility]   #High Blood Pressure Patient started on Procardia and lasix for elevated BP. Patient to continue lasix for 4 more days, and to continue taking Procardia daily. -Lasix 20 mg daily x 4 days -Procardia 30 mg daily  Follow up Visit:  Message sent to FT by Dr Higinio Plan:  Please schedule this patient for a In person postpartum visit in 6 weeks with the following provider: Any provider. Additional Postpartum F/U:BP check 1 week  Low risk pregnancy complicated by:  elevated BP shortly before delivery and postpartum  Delivery mode:  Vaginal, Spontaneous  Anticipated Birth Control:  POPs   09/13/2021 Holley Bouche, MD

## 2021-09-11 NOTE — Plan of Care (Signed)
Emiliya Chretien, RN 

## 2021-09-11 NOTE — Progress Notes (Signed)
Labor Progress Note Nayzeth Ridder is a 21 y.o. G1P0 at [redacted]w[redacted]d presented for IOL for DFM S:  Doing well. Balloon out ~2 hours ago. Ok with check and AROM if appropriate  O:  BP 124/68   Pulse 85   Temp 98.6 F (37 C) (Oral)   Resp 16   Ht 4\' 10"  (1.473 m)   Wt 75.5 kg   LMP 12/04/2020 Comment: irreg cycles  BMI 34.78 kg/m  EFM: 130/moderate variability/+accels/no decels  CVE: Dilation: 4 Effacement (%): 60 Cervical Position: Posterior Station: -2 Presentation: Vertex Exam by:: Dr. Ephriam Jenkins   A&P: 21 y.o. G1P0 at [redacted]w[redacted]d presented for IOL for DFM #Labor: Progressing well. FB now out. AROM performed during current check. Clear fluid. Tolerated well by fetus and patient. Discussed with patient that will likely start pitocin if contractions not frequent #Pain: considering epidural #FWB: Cat I #GBS negative  Warner Mccreedy, MD, MPH OB Fellow, Faculty Practice

## 2021-09-12 LAB — CBC
HCT: 27.5 % — ABNORMAL LOW (ref 36.0–46.0)
Hemoglobin: 9.4 g/dL — ABNORMAL LOW (ref 12.0–15.0)
MCH: 28.2 pg (ref 26.0–34.0)
MCHC: 34.2 g/dL (ref 30.0–36.0)
MCV: 82.6 fL (ref 80.0–100.0)
Platelets: 190 10*3/uL (ref 150–400)
RBC: 3.33 MIL/uL — ABNORMAL LOW (ref 3.87–5.11)
RDW: 15.8 % — ABNORMAL HIGH (ref 11.5–15.5)
WBC: 11.8 10*3/uL — ABNORMAL HIGH (ref 4.0–10.5)
nRBC: 0 % (ref 0.0–0.2)

## 2021-09-12 MED ORDER — NIFEDIPINE ER OSMOTIC RELEASE 30 MG PO TB24
30.0000 mg | ORAL_TABLET | Freq: Every day | ORAL | Status: DC
  Administered 2021-09-12 – 2021-09-13 (×2): 30 mg via ORAL
  Filled 2021-09-12 (×2): qty 1

## 2021-09-12 MED ORDER — FUROSEMIDE 20 MG PO TABS
20.0000 mg | ORAL_TABLET | Freq: Every day | ORAL | Status: DC
  Administered 2021-09-12 – 2021-09-13 (×2): 20 mg via ORAL
  Filled 2021-09-12 (×2): qty 1

## 2021-09-12 NOTE — Lactation Note (Signed)
This note was copied from a baby's chart. Lactation Consultation Note  Patient Name: Theresa Lane [id number] Date: 09/12/2021 Reason for consult: Initial assessment;Primapara;1st time breastfeeding;Term Age:20 hours   P1 mother whose infant is now 25 hours old.  This is a term baby at 40+1 weeks.  Mother's current feeding preference is breast.  Visitors in room; "[name]" awake.  Offered to assist with latching; mother receptive. Reviewed breast feeding basics and taught hand expression.  Mother unable to express drops at this time.  Discussed the importance of lots of STS, breast massage and hand expression to help ensure a good milk supply.  With mother's permission, removed "[name]'s" clothing.  Assisted to latch easily in the cross cradle hold.  Observed him feeding with intermittent stimulation for 6 minutes before leaving the room.  Mother denied discomfort with feeding.  Encouraged to feed 8-12 times/24 hours or sooner if he shows cues.  Continue to educate mother and family members with breast feeding basics.  Review body alignment, positioning, finger and hand placement and the importance of STS.  Mother will call as needed for latch assistance.  Father and two other visiots present and supportive.  RN updated.   Maternal Data Has patient been taught Hand Expression?: Yes Does the patient have breastfeeding experience prior to this delivery?: No  Feeding Mother's Current Feeding Choice: Breast Milk  LATCH Score Latch: Grasps breast easily, tongue down, lips flanged, rhythmical sucking.  Audible Swallowing: A few with stimulation  Type of Nipple: Everted at rest and after stimulation  Comfort (Breast/Nipple): Soft / non-tender  Hold (Positioning): Assistance needed to correctly position infant at breast and maintain latch.  LATCH Score: 8   Lactation Tools Discussed/Used    Interventions Interventions: Breast feeding basics reviewed;Assisted with latch;Skin to  skin;Breast massage;Hand express;Breast compression;Position options;Support pillows;Adjust position;Education;LC Services brochure  Discharge Pump:  (No pump for home use; provide manual pump on day of discharge)  Consult Status Consult Status: Follow-up Date: 09/13/21 Follow-up type: In-patient    Selig [place]ole R Pema Thomure 09/12/2021, 9:23 AM

## 2021-09-12 NOTE — Progress Notes (Signed)
Post Partum Day 1 (SVD @1400 ) Subjective: no complaints, up ad lib, voiding, and tolerating PO  Objective: Blood pressure 118/78, pulse 88, temperature 98.5 F (36.9 C), temperature source Oral, resp. rate 18, height 4\' 10"  (1.473 m), weight 75.5 kg, last menstrual period 12/04/2020, SpO2 99 %, unknown if currently breastfeeding.  Physical Exam:  General: alert, cooperative, and no distress Lochia: appropriate Uterine Fundus: firm although difficult to palpate. No significant bleeding DVT Evaluation: No evidence of DVT seen on physical exam. No significant calf/ankle edema.  Recent Labs    09/10/21 1854  HGB 11.6*  HCT 34.5*    Assessment/Plan: Plan for discharge tomorrow, Breastfeeding, Lactation consult, and Circumcision prior to discharge  #Elevated SBP and DBP Elevated post delivery BP to 134/85 and 135/88. BP in outpatient elevated X2 as well.  - Will start Nifedipine 30 mg qd and Lasix 20 mg qd (day 1 of 5)   LOS: 2 days   12/06/2020 09/12/2021, 10:12 AM   GME ATTESTATION:  I saw and evaluated the patient. I agree with the findings and the plan of care as documented in the resident's note.  Patient feeling a little lightheaded. Will obtain a CBC. Plan for dc tomorrow. Consented for neonatal circ.   Gilles Chiquito, DO OB Fellow, Faculty [facility], Center for [facility] 09/12/2021 3:49 PM

## 2021-09-12 NOTE — Anesthesia Postprocedure Evaluation (Signed)
Anesthesia Post Note  Patient: Theresa Lane  Procedure(s) Performed: AN AD HOC LABOR EPIDURAL     Patient location during evaluation: Mother Baby Anesthesia Type: Epidural Level of consciousness: awake and alert Pain management: pain level controlled Vital Signs Assessment: post-procedure vital signs reviewed and stable Respiratory status: spontaneous breathing, nonlabored ventilation and respiratory function stable Cardiovascular status: stable Postop Assessment: no headache, no backache and epidural receding Anesthetic complications: no   No notable events documented.  Last Vitals:  Vitals:   09/12/21 0056 09/12/21 0528  BP: 134/85 118/78  Pulse: (!) 102 88  Resp: 16 18  Temp: 37.1 C 36.9 C  SpO2: 99% 99%    Last Pain:  Vitals:   09/12/21 0737  TempSrc:   PainSc: 0-No pain   Pain Goal: Patients Stated Pain Goal: 0 (09/11/21 0100)              Epidural/Spinal Function Cutaneous sensation: Normal sensation (09/12/21 0737)  Marrion Coy

## 2021-09-13 LAB — BIRTH TISSUE RECOVERY COLLECTION (PLACENTA DONATION)

## 2021-09-13 MED ORDER — NORETHINDRONE 0.35 MG PO TABS: 1.0000 | ORAL_TABLET | Freq: Every day | ORAL | 2 refills | Status: DC

## 2021-09-13 MED ORDER — FUROSEMIDE 20 MG PO TABS: 20.0000 mg | ORAL_TABLET | Freq: Every day | ORAL | 0 refills | Status: DC

## 2021-09-13 MED ORDER — NIFEDIPINE ER 30 MG PO TB24: 30.0000 mg | ORAL_TABLET | Freq: Every day | ORAL | 0 refills | Status: DC

## 2021-09-13 NOTE — Lactation Note (Signed)
This note was copied from a baby's chart. Lactation Consultation Note  Patient Name: Theresa Lane [id number] Date: 09/13/2021 Reason for consult: Follow-up assessment;Primapara;1st time breastfeeding;Term;Breastfeeding assistance;Infant weight loss (6.46% WL) Age:20 hours  P1, Term, Infant Female, 6.46% WL  LC entered the room and baby was asleep next to mom. Per mom breastfeeding is going well, but she is unsure if she is making enough. LC asked mom why she was concerned about her production. Per mom, he eats and then wants to eat again after he's done. LC asked mom when was the last time baby ate. Per mom, it was 10:00 am. Mom states that she isn't hearing swallows every time baby eats. LC encouraged mom to call for a latch check when baby is ready to feed again so that [facility] can ensure that he is latching deeply and that he is feeding effectively. Mom states that she will call.   LC spoke with mom about engorgement, warning signs, infant I/O, and outpatient services.   Mom states that she has no further questions or concerns.   Current Feeding Plan:  Breastfeed 8+ times in 24 hours.  Watch baby's output and call pediatrician with concerns.  Call for outpatient [facility] assistance if needed.    LATCH Score Latch: Grasps breast easily, tongue down, lips flanged, rhythmical sucking.  Audible Swallowing: A few with stimulation  Type of Nipple: Everted at rest and after stimulation  Comfort (Breast/Nipple): Soft / non-tender  Hold (Positioning): No assistance needed to correctly position infant at breast.  LATCH Score: 9   Lactation Tools Discussed/Used    Interventions Interventions: Breast feeding basics reviewed;Education  Discharge Discharge Education: Engorgement and breast care;Warning signs for feeding baby;Outpatient recommendation  Consult Status Consult Status: Complete Date: 09/13/21 Follow-up type: Call as needed    Delene Loll 09/13/2021, 12:09  PM

## 2021-09-14 ENCOUNTER — Other Ambulatory Visit: Payer: Medicaid Other

## 2021-09-14 ENCOUNTER — Other Ambulatory Visit: Payer: Self-pay | Admitting: Obstetrics and Gynecology

## 2021-09-14 ENCOUNTER — Encounter: Payer: Medicaid Other | Admitting: Obstetrics & Gynecology

## 2021-09-14 ENCOUNTER — Encounter: Payer: Self-pay | Admitting: Obstetrics and Gynecology

## 2021-09-14 DIAGNOSIS — O165 Unspecified maternal hypertension, complicating the puerperium: Secondary | ICD-10-CM

## 2021-09-14 DIAGNOSIS — Z8679 Personal history of other diseases of the circulatory system: Secondary | ICD-10-CM | POA: Diagnosis not present

## 2021-09-14 MED ORDER — NIFEDIPINE ER 30 MG PO TB24
60.0000 mg | ORAL_TABLET | Freq: Every day | ORAL | 0 refills | Status: DC
Start: 2021-09-14 — End: 2021-11-26

## 2021-09-15 ENCOUNTER — Encounter: Payer: Self-pay | Admitting: Family Medicine

## 2021-09-19 ENCOUNTER — Telehealth (HOSPITAL_COMMUNITY): Payer: Self-pay | Admitting: *Deleted

## 2021-09-19 ENCOUNTER — Ambulatory Visit: Payer: Medicaid Other

## 2021-09-19 NOTE — Telephone Encounter (Signed)
Mom reports feeling good. No concerns about herself at this time. EPDS=1 [facility] score=4) Mom reports baby is doing well. Feeding, peeing, and pooping without difficulty. Safe sleep reviewed. Mom reports no concerns about baby at present.  Duffy Rhody, RN 09-19-2021 at 3:06pm

## 2021-09-20 ENCOUNTER — Telehealth: Payer: Self-pay | Admitting: *Deleted

## 2021-09-20 NOTE — Telephone Encounter (Signed)
Called patient in regards to blood pressure readings logged into [employer]. Patient states she has only been taking 1 tablet of Nifedipine as she thought that was all she was supposed to take. Has not seen message from Dr Shawnie Pons that was sent on 7/1  Advised patient to take 2 tablets as prescribed originally and to check blood pressure 30-45 minutes after taking medication. Patient verbalized understanding with no further questions.

## 2021-09-23 ENCOUNTER — Telehealth: Payer: Self-pay | Admitting: Obstetrics & Gynecology

## 2021-09-23 DIAGNOSIS — O165 Unspecified maternal hypertension, complicating the puerperium: Secondary | ICD-10-CM

## 2021-09-23 NOTE — Telephone Encounter (Signed)
     Faculty Practice OB/GYN Physician [employer] Phone Call Documentation  There was a [employer] notification of elevated BP of 156/92  for Theresa Lane who is s/p vaginal delivery on 09/11/21 complicated by postpartum hypertension. Currently on Procardia XL 60 mg daily.     I called Dorothe Pea at home, and after verifying two patient identifiers, asked about this BP.  Initially had a headache, denies any currently or other symptoms.  Repeat BP while on the phone was 132/92. She was told to continue prescribed medication and to continue BP checks; she will let us know or come to [facility] if BP still elevated or if she develops any concerning symptoms.     Jaynie Collins, MD, FACOG Obstetrician & Gynecologist, [facility] for [employer], [facility]

## 2021-09-25 ENCOUNTER — Encounter: Payer: Self-pay | Admitting: *Deleted

## 2021-09-27 ENCOUNTER — Encounter: Payer: Self-pay | Admitting: *Deleted

## 2021-10-23 ENCOUNTER — Ambulatory Visit (INDEPENDENT_AMBULATORY_CARE_PROVIDER_SITE_OTHER): Payer: Medicaid Other | Admitting: Women's Health

## 2021-10-23 ENCOUNTER — Encounter: Payer: Self-pay | Admitting: Women's Health

## 2021-10-23 DIAGNOSIS — Z30013 Encounter for initial prescription of injectable contraceptive: Secondary | ICD-10-CM

## 2021-10-23 LAB — POCT URINE PREGNANCY: Preg Test, Ur: NEGATIVE

## 2021-10-23 MED ORDER — MEDROXYPROGESTERONE ACETATE 150 MG/ML IM SUSP
150.0000 mg | Freq: Once | INTRAMUSCULAR | Status: AC
  Administered 2021-10-23: 150 mg via INTRAMUSCULAR

## 2021-10-23 MED ORDER — MEDROXYPROGESTERONE ACETATE 150 MG/ML IM SUSP: 150.0000 mg | INTRAMUSCULAR | 3 refills | Status: DC

## 2021-10-23 NOTE — Progress Notes (Signed)
POSTPARTUM VISIT Patient name: Theresa Lane MRN [id number]  Date of birth: Dec 14, 2000 Chief Complaint:   Postpartum Care  History of Present Illness:   Theresa Lane is a 21 y.o. G31P1001 Hispanic female being seen today for a postpartum visit. She is 6 weeks postpartum following a spontaneous vaginal delivery at 40.1 gestational weeks. IOL: yes, for decreased fetal movement. Anesthesia: epidural.  Laceration: labial and periurethral.  Complications: none. Inpatient contraception: no.   Pregnancy uncomplicated. Tobacco use: no. Substance use disorder: no. Last pap smear: <21yo and results were N/A. Next pap smear due: @ 22yo No LMP recorded.  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 Bleeding less flow than a normal period. Bowel function is normal, some pain w/ bm's. Bladder function is normal. Urinary incontinence? no, fecal incontinence? no Patient is not sexually active. Last sexual activity: prior to birth of baby. Desired contraception: Depo. Patient does want a pregnancy in the future.  Desired family size is uncertain # of children.   Upstream - 10/23/21 0957       Pregnancy Intention Screening   Does the patient want to become pregnant in the next year? No    Does the patient's partner want to become pregnant in the next year? No    Would the patient like to discuss contraceptive options today? Yes      Contraception Wrap Up   Current Method Abstinence    Contraception Counseling Provided Yes            The pregnancy intention screening data noted above was reviewed. Potential methods of contraception were discussed. The patient elected to proceed with No data recorded.  Edinburgh Postpartum Depression Screening: negative  Edinburgh Postnatal Depression Scale - 10/23/21 0953       Edinburgh Postnatal Depression Scale:  In the Past 7 Days   I have been able to laugh and see the funny side of  things. 0    I have looked forward with enjoyment to things. 0    I have blamed myself unnecessarily when things went wrong. 0    I have been anxious or worried for no good reason. 2    I have felt scared or panicky for no good reason. 0    Things have been getting on top of me. 0    I have been so unhappy that I have had difficulty sleeping. 0    I have felt sad or miserable. 0    I have been so unhappy that I have been crying. 0    The thought of harming myself has occurred to me. 0    Edinburgh Postnatal Depression Scale Total 2                06/06/2021    8:52 AM 03/08/2021   10:33 AM  GAD 7 : Generalized Anxiety Score  Nervous, Anxious, on Edge 1 1  Control/stop worrying 0 0  Worry too much - different things 1 1  Trouble relaxing 1 1  Restless 0 1  Easily annoyed or irritable 1 0  Afraid - awful might happen 1 1  Total GAD 7 Score 5 5     Baby's course has been uncomplicated. Baby is feeding by breast: milk supply adequate. Infant has a pediatrician/family doctor? Yes.  Childcare strategy if returning to work/school: n/a-stay at home mom.  Pt has material needs met for her and baby: Yes.   Review of  Systems:   Pertinent items are noted in HPI Denies Abnormal vaginal discharge w/ itching/odor/irritation, headaches, visual changes, shortness of breath, chest pain, abdominal pain, severe nausea/vomiting, or problems with urination or bowel movements. Pertinent History Reviewed:  Reviewed past medical,surgical, obstetrical and family history.  Reviewed problem list, medications and allergies. OB History  Gravida Para Term Preterm AB Living  1 1 1     1   SAB IAB Ectopic Multiple Live Births        0 1    # Outcome Date GA Lbr Len/2nd Weight Sex Delivery Anes PTL Lv  1 Term 09/11/21 [id number] / 00:38 8 lb 0.4 oz (3.64 kg) M Vag-Spont EPI  LIV   Physical Assessment:   Vitals:   10/23/21 0955  BP: 102/72  Pulse: 83  Weight: 143 lb (64.9 kg)  Body mass index is  29.89 kg/m.       Physical Examination:   General appearance: alert, well appearing, and in no distress  Mental status: alert, oriented to person, place, and time  Skin: warm & dry   Cardiovascular: normal heart rate noted   Respiratory: normal respiratory effort, no distress   Breasts: deferred, no complaints   Abdomen: soft, non-tender   Pelvic: lac healing well, still w/ suture exposed. Thin prep pap obtained: No  Rectal: not examined  Extremities: Edema: none   Chaperone: Latisha Cresenzo         No results found for this or any previous visit (from the past 24 hour(s)).  Assessment & Plan:  1) Postpartum exam 2) 6 wks s/p spontaneous vaginal delivery 3) breast feeding 4) Depression screening 5) Contraception management: 1st depo today, condoms x 2wks, rx sent 6) Resolved PPHTN> ok to stay off meds  Essential components of care per ACOG recommendations:  1.  Mood and well being:  If positive depression screen, discussed and plan developed.  If using tobacco we discussed reduction/cessation and risk of relapse If current substance abuse, we discussed and referral to local resources was offered.   2. Infant care and feeding:  If breastfeeding, discussed returning to work, pumping, breastfeeding-associated pain, guidance regarding return to fertility while lactating if not using another method. If needed, patient was provided with a letter to be allowed to pump q 2-3hrs to support lactation in a private location with access to a refrigerator to store breastmilk.   Recommended that all caregivers be immunized for flu, pertussis and other preventable communicable diseases If pt does not have material needs met for her/baby, referred to local resources for help obtaining these.  3. Sexuality, contraception and birth spacing Provided guidance regarding sexuality, management of dyspareunia, and resumption of intercourse Discussed avoiding interpregnancy interval <67ms and  recommended birth spacing of 18 months  4. Sleep and fatigue Discussed coping options for fatigue and sleep disruption Encouraged family/partner/community support of 4 hrs of uninterrupted sleep to help with mood and fatigue  5. Physical recovery  If pt had a C/S, assessed incisional pain and providing guidance on normal vs prolonged recovery If pt had a laceration, perineal healing and pain reviewed.  If urinary or fecal incontinence, discussed management and referred to PT or uro/gyn if indicated  Patient is safe to resume physical activity. Discussed attainment of healthy weight.  6.  Chronic disease management Discussed pregnancy complications if any, and their implications for future childbearing and long-term maternal health. Review recommendations for prevention of recurrent pregnancy complications, such as 17 hydroxyprogesterone caproate to reduce risk for recurrent  PTB not applicable, or aspirin to reduce risk of preeclampsia yes. Pt had GDM: no. If yes, 2hr GTT scheduled: not applicable. Reviewed medications and non-pregnant dosing including consideration of whether pt is breastfeeding using a reliable resource such as LactMed: not applicable Referred for f/u w/ PCP or subspecialist providers as indicated: not applicable  7. Health maintenance Mammogram at 20yo or earlier if indicated Pap smears as indicated  Meds:  Meds ordered this encounter  Medications   medroxyPROGESTERone (DEPO-PROVERA) 150 MG/ML injection    Sig: Inject 1 mL (150 mg total) into the muscle every 3 (three) months.    Dispense:  1 mL    Refill:  3    Order Specific Question:   Supervising Provider    Answer:   Florian Buff [[id number]]    Follow-up: Return for 11-13wks for next depo; 6/26 for pap & physical.   No orders of the defined types were placed in this encounter.   [place], [facility] 10/23/2021 10:16 AM

## 2021-10-23 NOTE — Patient Instructions (Signed)
Condoms for 2 weeks  Medroxyprogesterone Injection (Contraception) What is this medication? MEDROXYPROGESTERONE (me DROX ee proe JES te rone) prevents ovulation and pregnancy. It belongs to a group of medications called contraceptives. This medication is a progestin hormone. This medicine may be used for other purposes; ask your health care provider or pharmacist if you have questions. COMMON BRAND NAME(S): Depo-Provera, Depo-subQ Provera 104 What should I tell my care team before I take this medication? They need to know if you have any of these conditions: Asthma Blood clots Breast cancer or family history of breast cancer Depression Diabetes Eating disorder (anorexia nervosa) Heart attack High blood pressure HIV infection or AIDS If you often drink alcohol Kidney disease Liver disease Migraine headaches Osteoporosis, weak bones Seizures Stroke Tobacco smoker Vaginal bleeding An unusual or allergic reaction to medroxyprogesterone, other hormones, medications, foods, dyes, or preservatives Pregnant or trying to get pregnant Breast-feeding How should I use this medication? Depo-Provera CI contraceptive injection is given into a muscle. Depo-subQ Provera 104 injection is given under the skin. It is given in a hospital or clinic setting. The injection is usually given during the first 5 days after the start of a menstrual period or 6 weeks after delivery of a baby. A patient package insert for the product will be given with each prescription and refill. Be sure to read this information carefully each time. The sheet may change often. Talk to your care team about the use of this medication in children. Special care may be needed. These injections have been used in female children who have started having menstrual periods. Overdosage: If you think you have taken too much of this medicine contact a poison control center or emergency room at once. NOTE: This medicine is only for you. Do  not share this medicine with others. What if I miss a dose? Keep appointments for follow-up doses. You must get an injection once every 3 months. It is important not to miss your dose. Call your care team if you are unable to keep an appointment. What may interact with this medication? Antibiotics or medications for infections, especially rifampin and griseofulvin Antivirals for HIV or hepatitis Aprepitant Armodafinil Bexarotene Bosentan Medications for seizures like carbamazepine, felbamate, oxcarbazepine, phenytoin, phenobarbital, primidone, topiramate Mitotane Modafinil St. John's wort This list may not describe all possible interactions. Give your health care provider a list of all the medicines, herbs, non-prescription drugs, or dietary supplements you use. Also tell them if you smoke, drink alcohol, or use illegal drugs. Some items may interact with your medicine. What should I watch for while using this medication? This medication does not protect you against HIV infection (AIDS) or other sexually transmitted diseases. Use of this product may cause you to lose calcium from your bones. Loss of calcium may cause weak bones (osteoporosis). Only use this product for more than 2 years if other forms of birth control are not right for you. The longer you use this product for birth control the more likely you will be at risk for weak bones. Ask your care team how you can keep strong bones. You may have a change in bleeding pattern or irregular periods. Many females stop having periods while taking this medication. If you have received your injections on time, your chance of being pregnant is very low. If you think you may be pregnant, see your care team as soon as possible. Tell your care team if you want to get pregnant within the next year. The effect of  this medication may last a long time after you get your last injection. What side effects may I notice from receiving this medication? Side  effects that you should report to your care team as soon as possible: Allergic reactions--skin rash, itching, hives, swelling of the face, lips, tongue, or throat Blood clot--pain, swelling, or warmth in the leg, shortness of breath, chest pain Gallbladder problems--severe stomach pain, nausea, vomiting, fever Increase in blood pressure Liver injury--right upper belly pain, loss of appetite, nausea, light-colored stool, dark yellow or brown urine, yellowing skin or eyes, unusual weakness or fatigue New or worsening migraines or headaches Seizures Stroke--sudden numbness or weakness of the face, arm, or leg, trouble speaking, confusion, trouble walking, loss of balance or coordination, dizziness, severe headache, change in vision Unusual vaginal discharge, itching, or odor Worsening mood, feelings of depression Side effects that usually do not require medical attention (report to your care team if they continue or are bothersome): Breast pain or tenderness Dark patches of the skin on the face or other sun-exposed areas Irregular menstrual cycles or spotting Nausea Weight gain This list may not describe all possible side effects. Call your doctor for medical advice about side effects. You may report side effects to FDA at 1-800-FDA-1088. Where should I keep my medication? This injection is only given by a care team. It will not be stored at home. NOTE: This sheet is a summary. It may not cover all possible information. If you have questions about this medicine, talk to your doctor, pharmacist, or health care provider.  2023 Elsevier/Gold Standard (2020-05-07 00:00:00)

## 2021-11-21 ENCOUNTER — Ambulatory Visit: Payer: Medicaid Other | Admitting: Obstetrics & Gynecology

## 2021-11-26 ENCOUNTER — Ambulatory Visit (INDEPENDENT_AMBULATORY_CARE_PROVIDER_SITE_OTHER): Payer: Medicaid Other | Admitting: Obstetrics & Gynecology

## 2021-11-26 ENCOUNTER — Encounter: Payer: Self-pay | Admitting: Obstetrics & Gynecology

## 2021-11-26 ENCOUNTER — Other Ambulatory Visit (HOSPITAL_COMMUNITY)
Admission: RE | Admit: 2021-11-26 | Discharge: 2021-11-26 | Disposition: A | Discharge status: Home / Self Care | Payer: Medicaid Other | Source: Ambulatory Visit | Attending: Obstetrics & Gynecology | Admitting: Obstetrics & Gynecology

## 2021-11-26 VITALS — BP 117/79 | HR 93 | Wt 136.0 lb

## 2021-11-26 DIAGNOSIS — N939 Abnormal uterine and vaginal bleeding, unspecified: Secondary | ICD-10-CM | POA: Insufficient documentation

## 2021-11-26 NOTE — Progress Notes (Signed)
   GYN VISIT Patient name: Theresa Lane MRN [id number]  Date of birth: [birth year] Chief Complaint:   Menstrual Problem (Irregular bleeding on depo. )  History of Present Illness:   Theresa Lane is a 21 y.o. G27P1001 female being seen today for the following concerns:  AUB: Recent delivered 6/27 and continues to note bleeding.  Sometimes red, sometimes dark brown.  Bleeding is not very heavy, typically changing a  pad every 6 hours or so.  Sometimes note vaginal odor, no itching.  Occasional pelvic pain.  Sexually active since delivery- no issues.  Pt is breastfeeding without difficulty.  Records reviewed- CBC 09/12/2021: Hgb 9.4  No LMP recorded. Patient has had an injection.     06/06/2021    8:52 AM 03/08/2021   10:32 AM  Depression screen PHQ 2/9  Decreased Interest 2 1  Down, Depressed, Hopeless 1 0  PHQ - 2 Score 3 1  Altered sleeping 2 2  Tired, decreased energy 2 2  Change in appetite 1 2  Feeling bad or failure about yourself  0 0  Trouble concentrating 1 1  Moving slowly or fidgety/restless 1 1  Suicidal thoughts 0 0  PHQ-9 Score 10 9     Review of Systems:   Pertinent items are noted in HPI Denies fever/chills, dizziness, headaches, visual disturbances, fatigue, shortness of breath, chest pain, abdominal pain, vomiting, no problems with bowel movements, urination, or intercourse unless otherwise stated above.  Pertinent History Reviewed:  Reviewed past medical,surgical, social, obstetrical and family history.  Reviewed problem list, medications and allergies. Physical Assessment:   Vitals:   11/26/21 1456  BP: 117/79  Pulse: 93  Weight: 136 lb (61.7 kg)  Body mass index is 28.42 kg/m.       Physical Examination:   General appearance: alert, well appearing, and in no distress  Psych: mood appropriate, normal affect  Skin: warm & dry   Cardiovascular: normal heart rate noted  Respiratory: normal respiratory effort, no distress  Abdomen: soft,  non-tender   Pelvic: VULVA: normal appearing vulva with no masses, tenderness or lesions, VAGINA: normal appearing vagina with normal color, dark blood noted in vault <5cc no lesions, CERVIX: normal appearing cervix without discharge or lesions- no active bleeding noted, UTERUS: uterus is normal size, shape, consistency and nontender  Extremities: no edema   Chaperone:  pt declined     Assessment & Plan:  1) AUB -reassured pt that some AUB s/p Depot and delivery can be normal -plan to r/o underlying infection -suspect improvement within the next several weeks -if still present in October- pt may call in would consider 1 mos of OCP- reason to avoid is potential impact on breastfeeding -due to recent Hgb 9.4- will also plan to recheck blood work today -currently taking PNV daily   Orders Placed This Encounter  Procedures   CBC   Iron, TIBC and Ferritin Panel    Return if symptoms worsen or fail to improve, for June annual .   Myna Hidalgo, DO Attending Obstetrician & Gynecologist, [facility] for [facility], [facility]

## 2021-11-27 LAB — CBC
Hematocrit: 41.7 % (ref 34.0–46.6)
Hemoglobin: 13.5 g/dL (ref 11.1–15.9)
MCH: 26.5 pg — ABNORMAL LOW (ref 26.6–33.0)
MCHC: 32.4 g/dL (ref 31.5–35.7)
MCV: 82 fL (ref 79–97)
Platelets: 346 10*3/uL (ref 150–450)
RBC: 5.1 x10E6/uL (ref 3.77–5.28)
RDW: 14.1 % (ref 11.7–15.4)
WBC: 7 10*3/uL (ref 3.4–10.8)

## 2021-11-27 LAB — IRON,TIBC AND FERRITIN PANEL
Ferritin: 49 ng/mL (ref 15–150)
Iron Saturation: 30 % (ref 15–55)
Iron: 118 ug/dL (ref 27–159)
Total Iron Binding Capacity: 399 ug/dL (ref 250–450)
UIBC: 281 ug/dL (ref 131–425)

## 2021-11-28 LAB — CERVICOVAGINAL ANCILLARY ONLY
Bacterial Vaginitis (gardnerella): NEGATIVE
Candida Glabrata: NEGATIVE
Candida Vaginitis: NEGATIVE
Chlamydia: NEGATIVE
Comment: NEGATIVE
Comment: NEGATIVE
Comment: NEGATIVE
Comment: NEGATIVE
Comment: NEGATIVE
Comment: NORMAL
Neisseria Gonorrhea: NEGATIVE
Trichomonas: NEGATIVE

## 2022-01-09 ENCOUNTER — Ambulatory Visit: Payer: Medicaid Other

## 2022-01-21 ENCOUNTER — Ambulatory Visit (INDEPENDENT_AMBULATORY_CARE_PROVIDER_SITE_OTHER): Payer: Medicaid Other | Admitting: *Deleted

## 2022-01-21 DIAGNOSIS — Z3042 Encounter for surveillance of injectable contraceptive: Secondary | ICD-10-CM

## 2022-01-21 MED ORDER — MEDROXYPROGESTERONE ACETATE 150 MG/ML IM SUSP
150.0000 mg | Freq: Once | INTRAMUSCULAR | Status: AC
  Administered 2022-01-21: 150 mg via INTRAMUSCULAR

## 2022-01-21 NOTE — Progress Notes (Signed)
   NURSE VISIT- INJECTION  SUBJECTIVE:  Theresa Lane is a 21 y.o. G47P1001 female here for a Depo Provera for contraception/period management. She is a GYN patient.   OBJECTIVE:  There were no vitals taken for this visit.  Appears well, in no apparent distress  Injection administered in: Right deltoid  Meds ordered this encounter  Medications   medroxyPROGESTERone (DEPO-PROVERA) injection 150 mg    ASSESSMENT: GYN patient Depo Provera for contraception/period management PLAN: Follow-up: in 11-13 weeks for next Depo   Levy Pupa  01/21/2022 10:12 AM

## 2022-02-27 ENCOUNTER — Encounter (HOSPITAL_COMMUNITY): Payer: Self-pay | Admitting: *Deleted

## 2022-02-27 ENCOUNTER — Emergency Department (HOSPITAL_COMMUNITY)
Admission: EM | Admit: 2022-02-27 | Discharge: 2022-02-28 | Disposition: A | Discharge status: Home / Self Care | Payer: Medicaid Other | Attending: Emergency Medicine | Admitting: Emergency Medicine

## 2022-02-27 ENCOUNTER — Other Ambulatory Visit: Payer: Self-pay

## 2022-02-27 DIAGNOSIS — R519 Headache, unspecified: Secondary | ICD-10-CM | POA: Diagnosis present

## 2022-02-27 DIAGNOSIS — G43109 Migraine with aura, not intractable, without status migrainosus: Secondary | ICD-10-CM | POA: Diagnosis not present

## 2022-02-27 LAB — CBC WITH DIFFERENTIAL/PLATELET
Abs Immature Granulocytes: 0.02 10*3/uL (ref 0.00–0.07)
Basophils Absolute: 0 10*3/uL (ref 0.0–0.1)
Basophils Relative: 0 %
Eosinophils Absolute: 0.1 10*3/uL (ref 0.0–0.5)
Eosinophils Relative: 1 %
HCT: 38.2 % (ref 36.0–46.0)
Hemoglobin: 12.6 g/dL (ref 12.0–15.0)
Immature Granulocytes: 0 %
Lymphocytes Relative: 28 %
Lymphs Abs: 2.3 10*3/uL (ref 0.7–4.0)
MCH: 26.5 pg (ref 26.0–34.0)
MCHC: 33 g/dL (ref 30.0–36.0)
MCV: 80.3 fL (ref 80.0–100.0)
Monocytes Absolute: 0.4 10*3/uL (ref 0.1–1.0)
Monocytes Relative: 5 %
Neutro Abs: 5.3 10*3/uL (ref 1.7–7.7)
Neutrophils Relative %: 66 %
Platelets: 347 10*3/uL (ref 150–400)
RBC: 4.76 MIL/uL (ref 3.87–5.11)
RDW: 15.3 % (ref 11.5–15.5)
WBC: 8.2 10*3/uL (ref 4.0–10.5)
nRBC: 0 % (ref 0.0–0.2)

## 2022-02-27 LAB — BASIC METABOLIC PANEL
Anion gap: 10 (ref 5–15)
BUN: 16 mg/dL (ref 6–20)
CO2: 21 mmol/L — ABNORMAL LOW (ref 22–32)
Calcium: 9.1 mg/dL (ref 8.9–10.3)
Chloride: 108 mmol/L (ref 98–111)
Creatinine, Ser: 0.71 mg/dL (ref 0.44–1.00)
GFR, Estimated: >60 mL/min (ref >60)
Glucose, Bld: 86 mg/dL (ref 70–99)
Potassium: 4.1 mmol/L (ref 3.5–5.1)
Sodium: 139 mmol/L (ref 135–145)

## 2022-02-27 LAB — I-STAT BETA HCG BLOOD, ED (MC, WL, AP ONLY): I-stat hCG, quantitative: <5 m[IU]/mL (ref <5)

## 2022-02-27 NOTE — ED Triage Notes (Signed)
The pt has had a headache for 3-4 days nausea.  She has headaches but this one is not going away  lmp  lmp birth control

## 2022-02-27 NOTE — ED Provider Triage Note (Signed)
Emergency Medicine Provider Triage Evaluation Note  Theresa Lane , a 21 y.o. female  was evaluated in triage.  Pt complains of headache for the past few days with associated nausea.  She has had headaches in the past, but no history of migraines.  She states she has taken multiple OTC medications without relief.  Denies vision changes, syncope, sudden onset of headache, photophobia, vomiting, weakness.    Review of Systems  Positive: See above Negative: See above  Physical Exam  BP 124/75   Pulse 82   Temp 99.3 F (37.4 C) (Oral)   Resp 18   Ht 4\' 10"  (1.473 m)   Wt 61.7 kg   SpO2 100%   BMI 28.43 kg/m  Gen:   Awake, no distress   Resp:  Normal effort  MSK:   Moves extremities without difficulty  Other:  Patient ambulatory without difficulty or gait abnormality   Medical Decision Making  Medically screening exam initiated at 7:58 PM.  Appropriate orders placed.  was informed that the remainder of the evaluation will be completed by another provider, this initial triage assessment does not replace that evaluation, and the importance of remaining in the ED until their evaluation is complete.     Dorothe Pea, Lenard Simmer 02/27/22 2003

## 2022-02-28 ENCOUNTER — Emergency Department (HOSPITAL_COMMUNITY): Payer: Medicaid Other

## 2022-02-28 MED ORDER — SODIUM CHLORIDE 0.9 % IV BOLUS
1000.0000 mL | Freq: Once | INTRAVENOUS | Status: AC
  Administered 2022-02-28: 1000 mL via INTRAVENOUS

## 2022-02-28 MED ORDER — DIPHENHYDRAMINE HCL 50 MG/ML IJ SOLN
25.0000 mg | Freq: Once | INTRAMUSCULAR | Status: AC
  Administered 2022-02-28: 25 mg via INTRAVENOUS
  Filled 2022-02-28: qty 1

## 2022-02-28 MED ORDER — IOHEXOL 350 MG/ML SOLN
75.0000 mL | Freq: Once | INTRAVENOUS | Status: AC | PRN
  Administered 2022-02-28: 75 mL via INTRAVENOUS

## 2022-02-28 MED ORDER — PROCHLORPERAZINE EDISYLATE 10 MG/2ML IJ SOLN
10.0000 mg | Freq: Once | INTRAMUSCULAR | Status: AC
  Administered 2022-02-28: 10 mg via INTRAVENOUS
  Filled 2022-02-28: qty 2

## 2022-02-28 MED ORDER — ACETAMINOPHEN 500 MG PO TABS
1000.0000 mg | ORAL_TABLET | Freq: Once | ORAL | Status: AC
  Administered 2022-02-28: 1000 mg via ORAL
  Filled 2022-02-28: qty 2

## 2022-02-28 NOTE — ED Provider Notes (Signed)
[facility] EMERGENCY DEPARTMENT Provider Note   CSN: [id number] Arrival date & time: 02/27/22  1823     History Chief Complaint  Patient presents with   Headache    Theresa Lane is a 21 y.o. female.   Headache Associated symptoms: no eye pain, no fatigue and no fever   Patient presents to ER with 4 days of migraine headache. She states that the headache has not improved even with OTC medications including tylenol, ibuprofen, and Excedrin. Patient described headache as pulsatile in nature with left sided pain and associated nausea without resolution in >72 hours. Denies chest pain, shortness of breath, abdominal pain, diarrhea, congestion, fever, nasal discharge or dizziness.    Home Medications Prior to Admission medications   Medication Sig Start Date End Date Taking? Authorizing Provider  medroxyPROGESTERone (DEPO-PROVERA) 150 MG/ML injection Inject 1 mL (150 mg total) into the muscle every 3 (three) months. 10/23/21   Cheral Marker, CNM  Prenatal 27-1 MG TABS Take 1 tablet by mouth daily. 12/02/21   [provider]      Allergies    Patient has no known allergies.    Review of Systems   Review of Systems  Constitutional:  Negative for fatigue and fever.  Eyes:  Negative for pain and discharge.  Respiratory:  Negative for chest tightness.   Cardiovascular:  Negative for chest pain.  Neurological:  Positive for headaches.  All other systems reviewed and are negative.   Physical Exam Updated Vital Signs BP 117/62 (BP Location: Right Arm)   Pulse (!) 110   Temp 98.1 F (36.7 C) (Oral)   Resp 16   Ht 4\' 10"  (1.473 m)   Wt 61.7 kg   SpO2 100%   BMI 28.43 kg/m  Physical Exam Vitals and nursing note reviewed.  Constitutional:      Appearance: She is well-developed.  HENT:     Head: Normocephalic and atraumatic.  Cardiovascular:     Rate and Rhythm: Normal rate and regular rhythm.  Pulmonary:     Effort: Pulmonary effort  is normal.     Breath sounds: Normal breath sounds.  Abdominal:     Palpations: Abdomen is soft.  Skin:    General: Skin is warm and dry.     Capillary Refill: Capillary refill takes less than 2 seconds.  Neurological:     Mental Status: She is alert.     ED Results / Procedures / Treatments   Labs (all labs ordered are listed, but only abnormal results are displayed) Labs Reviewed  BASIC METABOLIC PANEL - Abnormal; Notable for the following components:      Result Value   CO2 21 (*)    All other components within normal limits  CBC WITH DIFFERENTIAL/PLATELET  I-STAT BETA HCG BLOOD, ED ([facility], [facility], [facility] ONLY)    EKG None  Radiology CT ANGIO HEAD NECK W WO CM  Result Date: 02/28/2022 CLINICAL DATA:  Sudden headache EXAM: CT ANGIOGRAPHY HEAD AND NECK TECHNIQUE: Multidetector CT imaging of the head and neck was performed using the standard protocol during bolus administration of intravenous contrast. Multiplanar CT image reconstructions and MIPs were obtained to evaluate the vascular anatomy. Carotid stenosis measurements (when applicable) are obtained utilizing NASCET criteria, using the distal internal carotid diameter as the denominator. RADIATION DOSE REDUCTION: This exam was performed according to the departmental dose-optimization program which includes automated exposure control, adjustment of the mA and/or kV according to patient size and/or use of iterative reconstruction technique.  CONTRAST:  71mL OMNIPAQUE IOHEXOL 350 MG/ML SOLN COMPARISON:  None Available. FINDINGS: CT HEAD FINDINGS Brain: No evidence of acute infarction, hemorrhage, hydrocephalus, extra-axial collection or mass lesion/mass effect. Small isolated calcification in the posterior right thalamus, no adjacent mass, edema, or encephalomalacia, considered incidental and non worrisome Vascular: See below Skull: Negative Sinuses: Unremarkable Review of the MIP images confirms the above findings CTA NECK FINDINGS Aortic arch:  Normal Right carotid system: Normal Left carotid system: Normal Vertebral arteries: Normal Skeleton: Unremarkable Other neck: Cluster of enlarged lymph nodes in the left jugular chain to posterior triangle. Bilateral submandibular lymph nodes are also enlarged. There is some increased density, especially of the a left submandibular node, question faint calcification. No cavitation or regional fat inflammation. A left upper jugular chain node measures 16 mm in long axis. No primary mass or inflammation seen in the throat. Upper chest: Negative.  No upper mediastinal adenopathy. Review of the MIP images confirms the above findings CTA HEAD FINDINGS Anterior circulation: No significant stenosis, proximal occlusion, aneurysm, or vascular malformation. Posterior circulation: No significant stenosis, proximal occlusion, aneurysm, or vascular malformation. Venous sinuses: As permitted by contrast timing, patent. Anatomic variants: None significant Review of the MIP images confirms the above findings IMPRESSION: 1. Negative CTA of the head and neck, no explanation for headache. 2. Enlarged lymph nodes in the neck without visible underlying pharyngitis. Correlate for URI symptoms although the nodes are asymmetrically larger on the left, recommend follow-up to exclude granulomatous or lymphoproliferative disease. Electronically Signed   By: Jorje Guild M.D.   On: 02/28/2022 06:53    Procedures Procedures   Medications Ordered in ED Medications  acetaminophen (TYLENOL) tablet 1,000 mg (1,000 mg Oral Given 02/28/22 0518)  prochlorperazine (COMPAZINE) injection 10 mg (10 mg Intravenous Given 02/28/22 0519)  diphenhydrAMINE (BENADRYL) injection 25 mg (25 mg Intravenous Given 02/28/22 0520)  sodium chloride 0.9 % bolus 1,000 mL (0 mLs Intravenous Stopped 02/28/22 0639)  iohexol (OMNIPAQUE) 350 MG/ML injection 75 mL (75 mLs Intravenous Contrast Given 02/28/22 0556)    ED Course/ Medical Decision Making/ A&P                            Medical Decision Making  This patient presents to the ED for concern of headache. Differential diagnosis includes migraine headache, cluster headache, meningitis, viral infection, acute sinusitis   Lab Tests:  I Ordered, and personally interpreted labs.  The pertinent results include: CBC and BMP normal. Negative hCG   Medicines ordered and prescription drug management:  I ordered medication including acetaminophen, compazine, diphenhydramine, and fluids for migraine  Reevaluation of the patient after these medicines showed that the patient improved I have reviewed the patients home medicines and have made adjustments as needed   Problem List / ED Course:  Patient presented to the ER following 4 days of persistent headache. She reports some visual field changes describes as "static" with white spots in vision. Given that patient's labs were reassuring and headache has responded poorly to OTC medications, migraine cocktail ordered and administered while patient in the ER.   Final Clinical Impression(s) / ED Diagnoses Final diagnoses:  Migraine with aura and without status migrainosus, not intractable    Rx / DC Orders ED Discharge Orders     None         Luvenia Heller, PA-C 02/28/22 [id number]    Maudie Flakes, MD 03/01/22 [phone]

## 2022-02-28 NOTE — Discharge Instructions (Addendum)
You were seen in the ER today and treated for a migraine headache. All of your labwork and imaging was reassuring at this time. You were given several medications while here in the ER which resolved the migraine. At home, monitor for recurrence of migraine symptoms and make sure to take any medications for headaches when symptoms start.

## 2022-04-15 ENCOUNTER — Ambulatory Visit: Payer: Medicaid Other

## 2022-04-16 ENCOUNTER — Ambulatory Visit (INDEPENDENT_AMBULATORY_CARE_PROVIDER_SITE_OTHER): Payer: Medicaid Other | Admitting: *Deleted

## 2022-04-16 DIAGNOSIS — Z3042 Encounter for surveillance of injectable contraceptive: Secondary | ICD-10-CM

## 2022-04-16 MED ORDER — MEDROXYPROGESTERONE ACETATE 150 MG/ML IM SUSP
150.0000 mg | Freq: Once | INTRAMUSCULAR | Status: AC
  Administered 2022-04-16: 150 mg via INTRAMUSCULAR

## 2022-04-16 NOTE — Progress Notes (Signed)
   NURSE VISIT- INJECTION  SUBJECTIVE:  Theresa Lane is a 22 y.o. G58P1001 female here for a Depo Provera for contraception/period management. She is a GYN patient.   OBJECTIVE:  There were no vitals taken for this visit.  Appears well, in no apparent distress  Injection administered in: Left deltoid  Meds ordered this encounter  Medications   medroxyPROGESTERone (DEPO-PROVERA) injection 150 mg    ASSESSMENT: GYN patient Depo Provera for contraception/period management PLAN: Follow-up: in 11-13 weeks for next Depo   Alice Rieger  04/16/2022 9:46 AM

## 2022-05-13 IMAGING — US US OB < 14 WEEKS - US OB TV
1 series · 15 of 28 positions shown · non-contrast
Comparison: None.

CLINICAL DATA: Abdominal pain



[Series 1: us ob < 14 weeks - us ob tv · 15 of 68 slices shown]
[im 1/68]
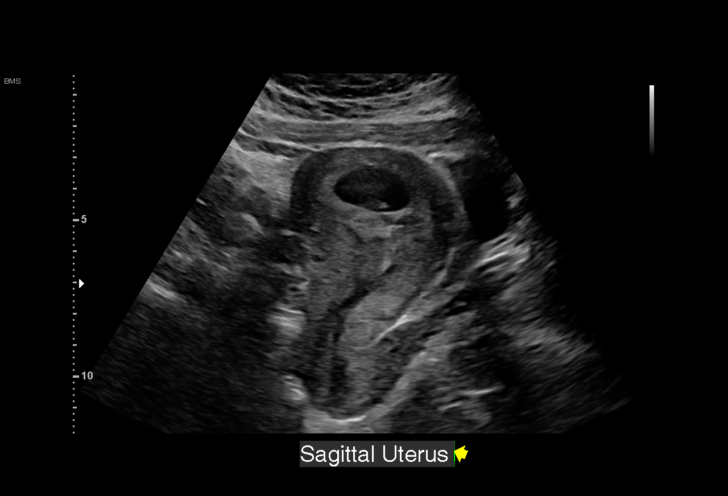
[im 5/68]
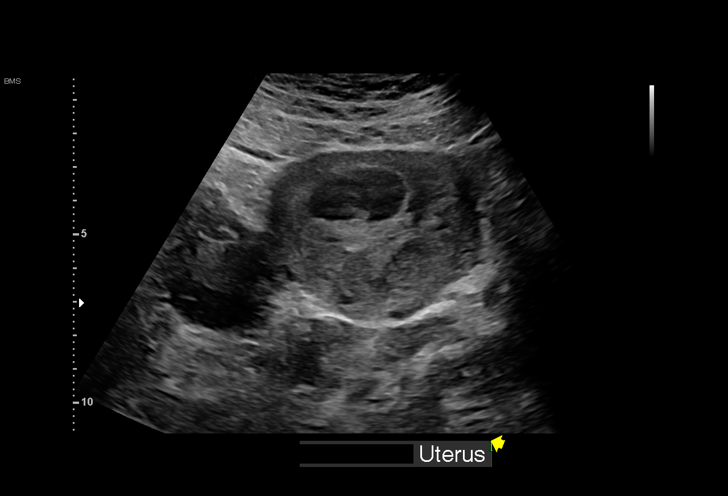
[im 10/68]
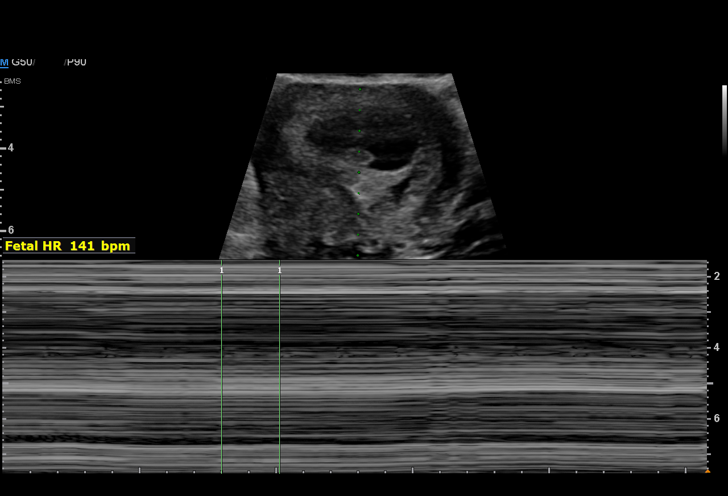
[im 15/68]
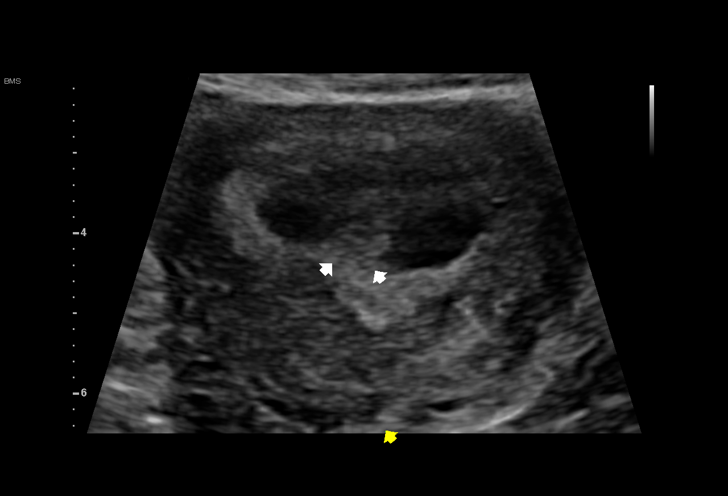
[im 20/68]
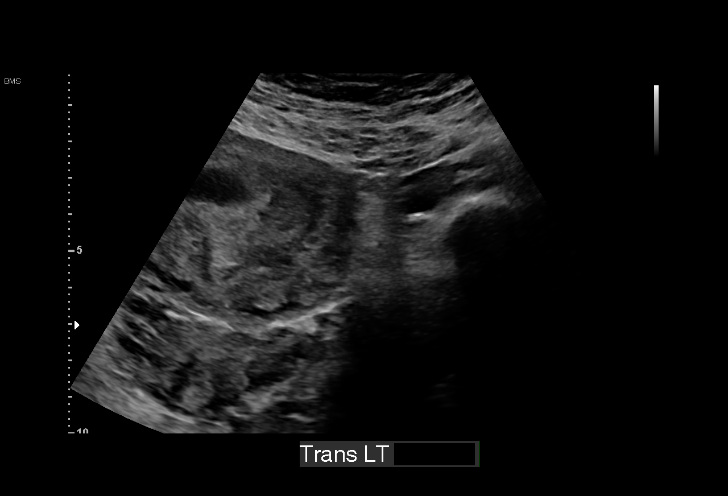
[im 25/68]
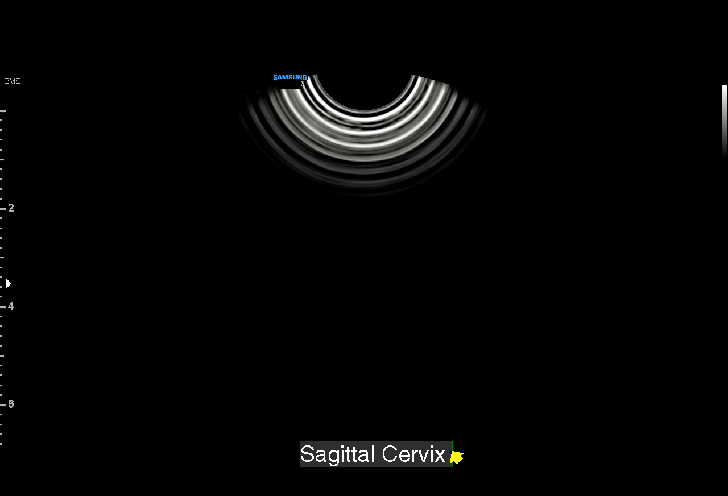
[im 30/68]
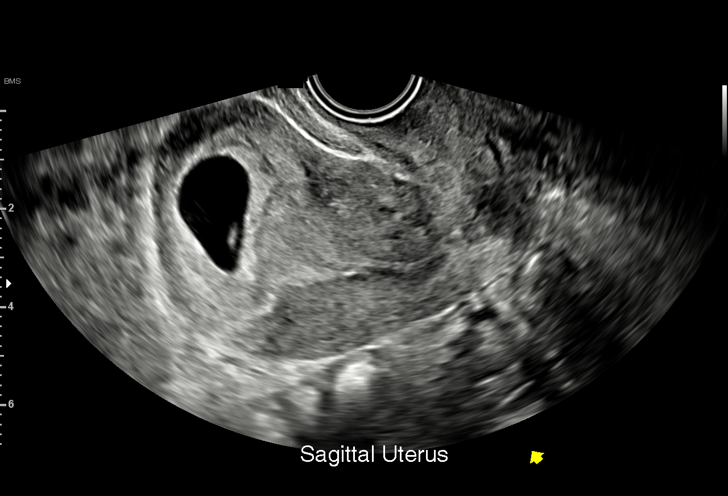
[im 35/68]
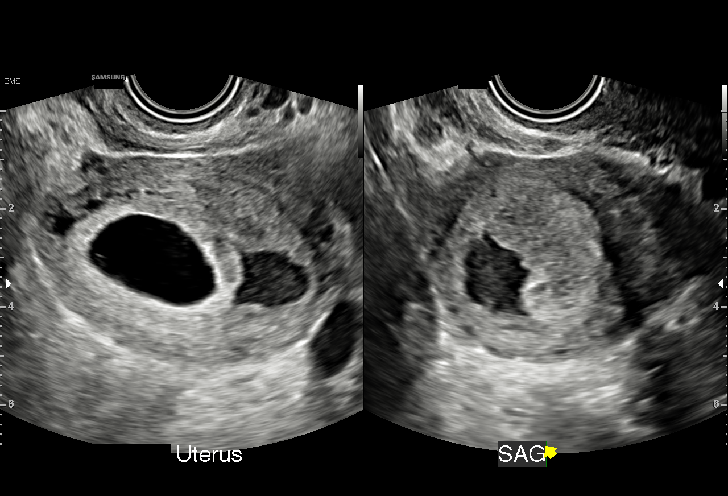
[im 38/68]
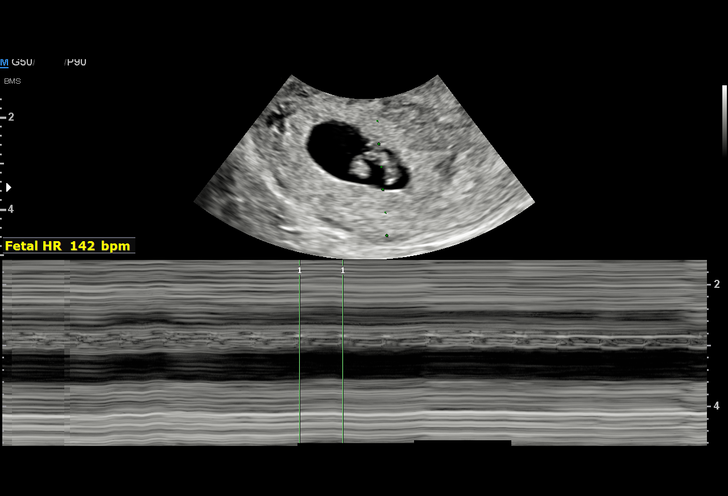
[im 43/68]
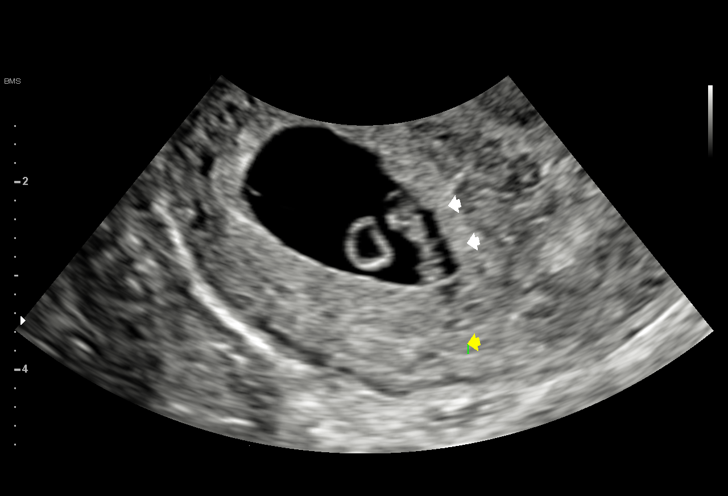
[im 48/68]
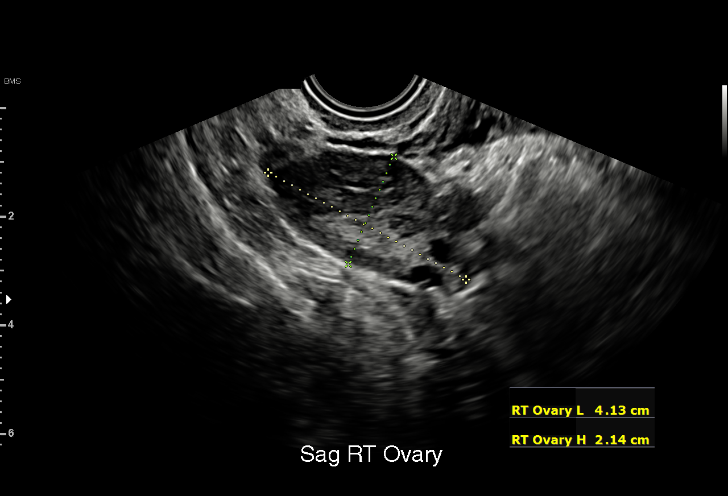
[im 53/68]
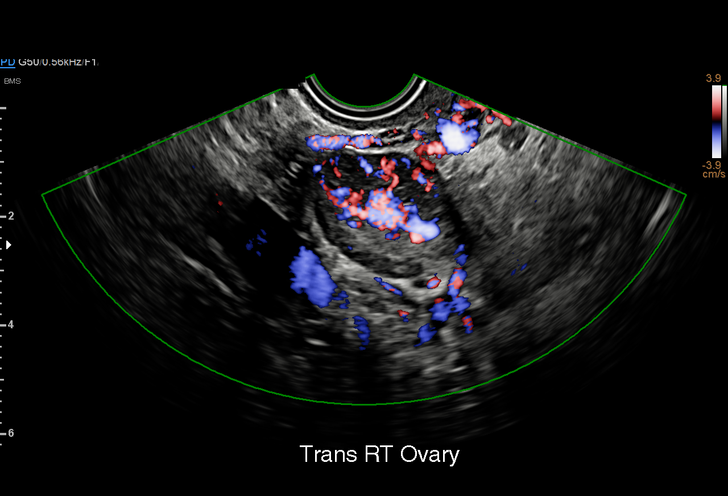
[im 58/68]
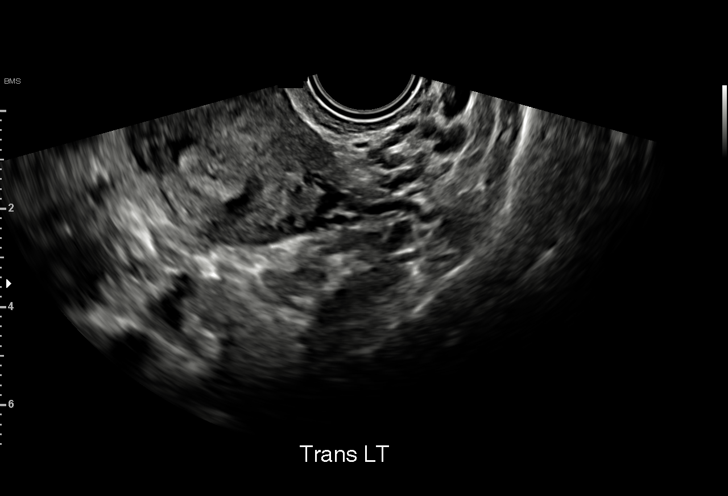
[im 63/68]
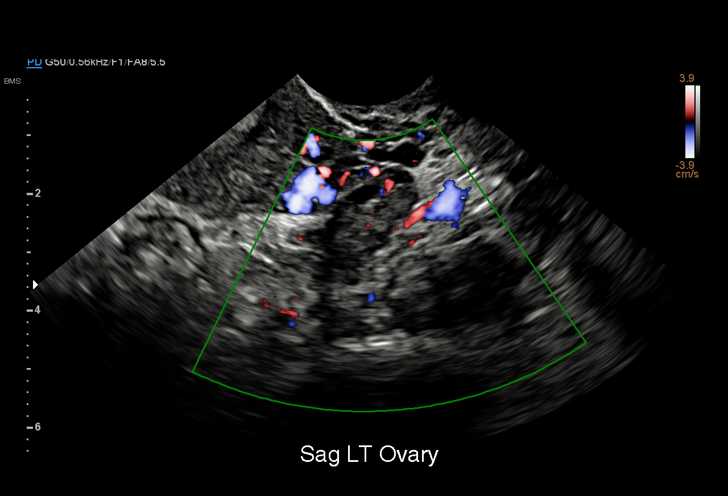
[im 68/68]
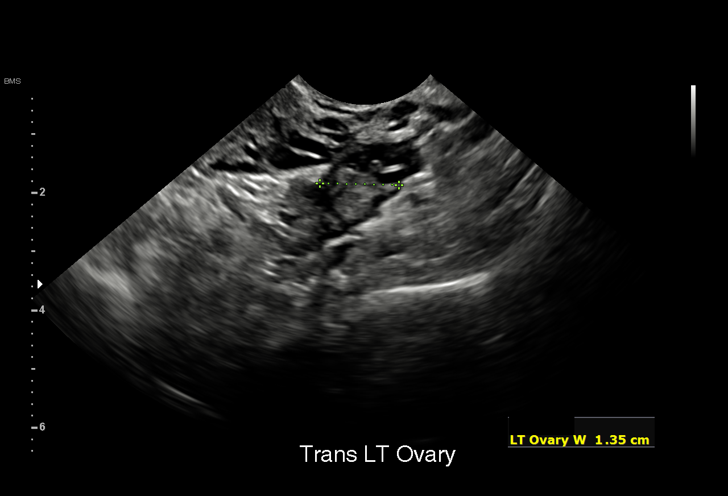

[15 of 28 positions shown; findings below may reference images not displayed]

FINDINGS: Intrauterine gestational sac: Single

Yolk sac:  Visualized.

Embryo:  Visualized.

Cardiac Activity: Visualized.

Heart Rate: 142 bpm

CRL:   10.8 mm   7 w 1 d                  US EDC: 09/10/2021

Subchorionic hemorrhage:  None visualized.

Maternal uterus/adnexae: Normal

Pulsed Doppler evaluation of both ovaries demonstrates normal
appearing low-resistance arterial and venous waveforms.
IMPRESSION: Single live intrauterine gestation with approximate gestational age
of 7 weeks and 1 day. EDC based on today's sonogram is 09/10/2021.

## 2022-07-09 ENCOUNTER — Ambulatory Visit (INDEPENDENT_AMBULATORY_CARE_PROVIDER_SITE_OTHER): Payer: Medicaid Other | Admitting: *Deleted

## 2022-07-09 DIAGNOSIS — Z3042 Encounter for surveillance of injectable contraceptive: Secondary | ICD-10-CM | POA: Diagnosis not present

## 2022-07-09 MED ORDER — MEDROXYPROGESTERONE ACETATE 150 MG/ML IM SUSP
150.0000 mg | Freq: Once | INTRAMUSCULAR | Status: AC
  Administered 2022-07-09: 150 mg via INTRAMUSCULAR

## 2022-07-09 NOTE — Progress Notes (Signed)
   NURSE VISIT- INJECTION  SUBJECTIVE:  Theresa Lane is a 22 y.o. G45P1001 female here for a Depo Provera for contraception/period management. She is a GYN patient.   OBJECTIVE:  There were no vitals taken for this visit.  Appears well, in no apparent distress  Injection administered in: Right deltoid  Meds ordered this encounter  Medications   medroxyPROGESTERone (DEPO-PROVERA) injection 150 mg    ASSESSMENT: GYN patient Depo Provera for contraception/period management PLAN: Follow-up: in 11-13 weeks for next Depo   Jobe Marker  07/09/2022 9:59 AM

## 2022-10-01 ENCOUNTER — Other Ambulatory Visit (HOSPITAL_COMMUNITY)
Admission: RE | Admit: 2022-10-01 | Discharge: 2022-10-01 | Disposition: A | Discharge status: Home / Self Care | Payer: Medicaid Other | Source: Ambulatory Visit | Attending: Obstetrics & Gynecology | Admitting: Obstetrics & Gynecology

## 2022-10-01 ENCOUNTER — Ambulatory Visit (INDEPENDENT_AMBULATORY_CARE_PROVIDER_SITE_OTHER): Payer: Medicaid Other | Admitting: *Deleted

## 2022-10-01 DIAGNOSIS — Z3042 Encounter for surveillance of injectable contraceptive: Secondary | ICD-10-CM | POA: Diagnosis not present

## 2022-10-01 DIAGNOSIS — N898 Other specified noninflammatory disorders of vagina: Secondary | ICD-10-CM

## 2022-10-01 MED ORDER — MEDROXYPROGESTERONE ACETATE 150 MG/ML IM SUSP
150.0000 mg | Freq: Once | INTRAMUSCULAR | Status: AC
  Administered 2022-10-01: 150 mg via INTRAMUSCULAR

## 2022-10-01 NOTE — Progress Notes (Signed)
   NURSE VISIT- VAGINITIS/STD/POC  SUBJECTIVE:  Theresa Lane is a 22 y.o. G1P1001 GYN patientfemale here for a vaginal swab for vaginitis screening and depo.  She reports the following symptoms: local irritation and odor for 1 day. Denies abnormal vaginal bleeding, significant pelvic pain, fever, or UTI symptoms.  OBJECTIVE:  Breastfeeding Yes   Appears well, in no apparent distress  ASSESSMENT: Vaginal swab for vaginitis screening Depo Provera  PLAN: Self-collected vaginal probe for Gonorrhea, Chlamydia, Trichomonas, Bacterial Vaginosis, Yeast sent to lab Treatment: to be determined once results are received Follow-up as needed if symptoms persist/worsen, or new symptoms develop  Jobe Marker  10/01/2022 9:41 AM

## 2022-10-02 LAB — CERVICOVAGINAL ANCILLARY ONLY
Bacterial Vaginitis (gardnerella): NEGATIVE
Candida Glabrata: NEGATIVE
Candida Vaginitis: NEGATIVE
Chlamydia: NEGATIVE
Comment: NEGATIVE
Comment: NEGATIVE
Comment: NEGATIVE
Comment: NEGATIVE
Comment: NEGATIVE
Comment: NORMAL
Neisseria Gonorrhea: NEGATIVE
Trichomonas: NEGATIVE

## 2022-10-03 ENCOUNTER — Encounter: Payer: Self-pay | Admitting: Obstetrics & Gynecology

## 2022-11-25 ENCOUNTER — Ambulatory Visit: Payer: Medicaid Other | Admitting: Women's Health

## 2022-11-25 ENCOUNTER — Encounter: Payer: Self-pay | Admitting: Women's Health

## 2022-11-25 ENCOUNTER — Other Ambulatory Visit (HOSPITAL_COMMUNITY)
Admission: RE | Admit: 2022-11-25 | Discharge: 2022-11-25 | Disposition: A | Discharge status: Home / Self Care | Payer: Medicaid Other | Source: Ambulatory Visit | Attending: Women's Health | Admitting: Women's Health

## 2022-11-25 VITALS — BP 127/77 | HR 97 | Ht <= 58 in | Wt 139.0 lb

## 2022-11-25 DIAGNOSIS — Z124 Encounter for screening for malignant neoplasm of cervix: Secondary | ICD-10-CM

## 2022-11-25 DIAGNOSIS — Z30011 Encounter for initial prescription of contraceptive pills: Secondary | ICD-10-CM | POA: Diagnosis not present

## 2022-11-25 MED ORDER — SLYND 4 MG PO TABS: 1.0000 | ORAL_TABLET | Freq: Every day | ORAL | 3 refills | Status: DC

## 2022-11-25 NOTE — Progress Notes (Signed)
   GYN VISIT Patient name: Theresa Lane MRN [id number]  Date of birth: 10-17-2000 Chief Complaint:   change birth control (Having migraine/ pap smear)  History of Present Illness:   Theresa Lane is a 22 y.o. G44P1001 Hispanic female being seen today to discuss getting off depo, has been having migraines since starting. Still breastfeeding. Discussed options, wants POPs, discussed micronor vs Slynd, wants Slynd.     No LMP recorded. Patient has had an injection. The current method of family planning is Depo-Provera injections.  Last pap never. Results were: N/A     06/06/2021    8:52 AM 03/08/2021   10:32 AM  Depression screen PHQ 2/9  Decreased Interest 2 1  Down, Depressed, Hopeless 1 0  PHQ - 2 Score 3 1  Altered sleeping 2 2  Tired, decreased energy 2 2  Change in appetite 1 2  Feeling bad or failure about yourself  0 0  Trouble concentrating 1 1  Moving slowly or fidgety/restless 1 1  Suicidal thoughts 0 0  PHQ-9 Score 10 9        06/06/2021    8:52 AM 03/08/2021   10:33 AM  GAD 7 : Generalized Anxiety Score  Nervous, Anxious, on Edge 1 1  Control/stop worrying 0 0  Worry too much - different things 1 1  Trouble relaxing 1 1  Restless 0 1  Easily annoyed or irritable 1 0  Afraid - awful might happen 1 1  Total GAD 7 Score 5 5     Review of Systems:   Pertinent items are noted in HPI Denies fever/chills, dizziness, headaches, visual disturbances, fatigue, shortness of breath, chest pain, abdominal pain, vomiting, abnormal vaginal discharge/itching/odor/irritation, problems with periods, bowel movements, urination, or intercourse unless otherwise stated above.  Pertinent History Reviewed:  Reviewed past medical,surgical, social, obstetrical and family history.  Reviewed problem list, medications and allergies. Physical Assessment:   Vitals:   11/25/22 1430  BP: 127/77  Pulse: 97  Weight: 139 lb (63 kg)  Height: 4\' 10"  (1.473 m)  Body mass index  is 29.05 kg/m.       Physical Examination:   General appearance: alert, well appearing, and in no distress  Mental status: alert, oriented to person, place, and time  Skin: warm & dry   Cardiovascular: normal heart rate noted  Respiratory: normal respiratory effort, no distress  Abdomen: soft, non-tender   Pelvic: vulva, vagina, cervix appears normal, thin prep pap obtained  Extremities: no edema   Chaperone: Peggy Dones    No results found for this or any previous visit (from the past 24 hour(s)).  Assessment & Plan:  1) Contraception management> rx Slynd to [facility], condoms x 2wks  2) Cervical cancer screening> pap today  Meds:  Meds ordered this encounter  Medications   Drospirenone (SLYND) 4 MG TABS    Sig: Take 1 tablet (4 mg total) by mouth daily.    Dispense:  90 tablet    Refill:  3    No orders of the defined types were placed in this encounter.   Return in about 1 year (around 11/25/2023) for Physical.  Cheral Marker CNM, [facility] 11/25/2022 2:56 PM

## 2022-11-27 LAB — CYTOLOGY - PAP
Comment: NEGATIVE
Diagnosis: NEGATIVE
High risk HPV: NEGATIVE

## 2022-12-02 DIAGNOSIS — Z124 Encounter for screening for malignant neoplasm of cervix: Secondary | ICD-10-CM | POA: Diagnosis present

## 2023-01-14 ENCOUNTER — Ambulatory Visit: Payer: Self-pay

## 2023-01-14 NOTE — Telephone Encounter (Signed)
  Chief Complaint: Vomiting and diarrhea Symptoms: above Frequency: Friday Pertinent Negatives: Patient denies weakness, dehydration Disposition: [] ED /[x] Urgent Care (no appt availability in office) / [] Appointment(In office/virtual)/ []  [facility]/ [] Home Care/ [] Refused Recommended Disposition /[]  Mobile Bus/ []  Follow-up with PCP Additional Notes: Pt has had vomiting and diarrhea since Friday. Diarrhea has mostly resolved, but pt is still vomiting. Pt vomited 3 times today. Pt is able to keep fluids down. Gave pt options of UC, MU ot VV. Pt thinks she will go to UC for care.    Summary: diarrhea   Patient called stated she has had diarrhea since last Friday. No appts for new patient until Jan 25. Please f/u with patient     Reason for Disposition  [1] MILD or MODERATE vomiting AND [2] present > 48 hours (2 days) (Exception: Mild vomiting with associated diarrhea.)  Answer Assessment - Initial Assessment Questions 1. VOMITING SEVERITY: "How many times have you vomited in the past 24 hours?"     - MILD:  1 - 2 times/day    - MODERATE: 3 - 5 times/day, decreased oral intake without significant weight loss or symptoms of dehydration    - SEVERE: 6 or more times/day, vomits everything or nearly everything, with significant weight loss, symptoms of dehydration      Moderate 2. ONSET: "When did the vomiting begin?"      Friday 3. FLUIDS: "What fluids or food have you vomited up today?" "Have you been able to keep any fluids down?"   Fluids down 4. ABDOMEN PAIN: "Are your having any abdomen pain?" If Yes : "How bad is it and what does it feel like?" (e.g., crampy, dull, intermittent, constant)      no 5. DIARRHEA: "Is there any diarrhea?" If Yes, ask: "How many times today?"      yes 6. CONTACTS: "Is there anyone else in the family with the same symptoms?"      no 7. CAUSE: "What do you think is causing your vomiting?"     unsure 8. HYDRATION STATUS: "Any signs  of dehydration?" (e.g., dry mouth [not only dry lips], too weak to stand) "When did you last urinate?"     no 9. OTHER SYMPTOMS: "Do you have any other symptoms?" (e.g., fever, headache, vertigo, vomiting blood or coffee grounds, recent head injury)     Dizzy - migraine  Protocols used: Vomiting-A-AH

## 2023-01-20 ENCOUNTER — Encounter: Payer: Self-pay | Admitting: Neurology

## 2023-01-20 ENCOUNTER — Ambulatory Visit: Payer: Medicaid Other | Admitting: Neurology

## 2023-01-20 VITALS — BP 120/78 | HR 70 | Ht <= 58 in | Wt 138.0 lb

## 2023-01-20 DIAGNOSIS — R270 Ataxia, unspecified: Secondary | ICD-10-CM | POA: Diagnosis not present

## 2023-01-20 DIAGNOSIS — G44211 Episodic tension-type headache, intractable: Secondary | ICD-10-CM

## 2023-01-20 DIAGNOSIS — Z8759 Personal history of other complications of pregnancy, childbirth and the puerperium: Secondary | ICD-10-CM

## 2023-01-20 DIAGNOSIS — G43111 Migraine with aura, intractable, with status migrainosus: Secondary | ICD-10-CM | POA: Diagnosis not present

## 2023-01-20 DIAGNOSIS — Z8679 Personal history of other diseases of the circulatory system: Secondary | ICD-10-CM | POA: Diagnosis not present

## 2023-01-20 MED ORDER — PREDNISONE 10 MG PO TABS: ORAL_TABLET | ORAL | 1 refills | Status: DC

## 2023-01-20 MED ORDER — SUMATRIPTAN SUCCINATE 100 MG PO TABS: 100.0000 mg | ORAL_TABLET | Freq: Once | ORAL | 2 refills | Status: DC | PRN

## 2023-01-20 NOTE — Addendum Note (Signed)
Addended by: Melvyn Novas on: 01/20/2023 02:48 PM   Modules accepted: Orders

## 2023-01-20 NOTE — Progress Notes (Signed)
[facility]  Provider:  Melvyn Novas, MD  Referring Provider: Debroah Baller,* Primary Care Physician:  "  Chief Complaint  Patient presents with   Migraine    RM 1 with mother and child Pt is well, reports she has been having migraines at least since Dec last yr when she went to ED. She has about 10-15 headaches a month. Associated dizziness, nausea vomiting.     HPI:  Theresa Lane is a 22 y.o. female and seen here upon referral from FNP [place] for a Consultation/ Evaluation of migraine headaches.   This patient reports onset of Severe and frequent migraine headaches after the birth of her child one day after her 21th Birthday .   Most migraines affect the left side, retro-orbital pain, nausea - even vomiting -but no photophobia.  Sweet smells can be a trigger.   Some times pain radiates to the temple and nape of the neck. Other headaches are described as a vice around the neck, squeezing her.  She reports sometimes been woken by acute headaches, sometimes they escalate over the day. No r trigger by foods, sleep habits or hydration.   Over the months since onset the longest headache free time a has been a week.  15 migraines a months ,and sometimes lasting many days, at least 24 ours- status.  Some floaters announce the migraines.   Sometimes she feels dizziness and a heaviness.   Triggers for headaches have been hot weather and she reports minor  effect of hot or cold packs.   She changed from a depot birth control to a oral hormone , no effect.  She still breast feeds baby Theresa Lane .    Review of Systems: Out of a complete 14 system review, the patient complains of only the following symptoms, and all other reviewed systems are negative.  Epworth Sleepiness score: na  More headache days than not.     Social History   Socioeconomic History   Marital status: Single    Spouse name: Not on file   Number of children: One son, born  09-11-2021   Years of education: Not on file   Highest education level:  Graduate HS   Occupational History   Not on file  Tobacco Use   Smoking status: Never   Smokeless tobacco: Never  Vaping Use   Vaping status: Former  Substance and Sexual Activity   Alcohol use: Never   Drug use: Never   Sexual activity: Yes    Birth control/protection: None, Injection  Other Topics Concern   Not on file  Social History Narrative   Not on file   Social Determinants of Health   Financial Resource Strain: Low Risk  (10/23/2021)   Overall Financial Resource Strain (CARDIA)    Difficulty of Paying Living Expenses: Not hard at all  Food Insecurity: No Food Insecurity (10/23/2021)   Hunger Vital Sign    Worried About Running Out of Food in the Last Year: Never true    Ran Out of Food in the Last Year: Never true  Transportation Needs: No Transportation Needs (10/23/2021)   PRAPARE - Administrator, Civil Service (Medical): No    Lack of Transportation (Non-Medical): No  Physical Activity: Inactive (10/23/2021)   Exercise Vital Sign    Days of Exercise per Week: 0 days    Minutes of Exercise per Session: 0 min  Stress: No Stress Concern Present (10/23/2021)   [employer] of Occupational Health - Occupational Stress  Questionnaire    Feeling of Stress : Not at all  Social Connections: Moderately Isolated (10/23/2021)   Social Connection and Isolation Panel [NHANES]    Frequency of Communication with Friends and Family: Three times a week    Frequency of Social Gatherings with Friends and Family: Twice a week    Attends Religious Services: Never    Database administrator or Organizations: No    Attends Banker Meetings: Never    Marital Status: Living with partner  Intimate Partner Violence: Not At Risk (10/23/2021)   Humiliation, Afraid, Rape, and Kick questionnaire    Fear of Current or Ex-Partner: No    Emotionally Abused: No    Physically Abused: No    Sexually  Abused: No    Family History  Problem Relation Age of Onset   Asthma Mother    Healthy Father     Past Medical History:  Diagnosis Date   Anemia    Medical history non-contributory     Past Surgical History:  Procedure Laterality Date   NO PAST SURGERIES      Current Outpatient Medications  Medication Sig Dispense Refill   Drospirenone (SLYND) 4 MG TABS Take 1 tablet (4 mg total) by mouth daily. 90 tablet 3   SUMAtriptan (IMITREX) 50 MG tablet TAKE 1 TABLET BY MOUTH AT ONSET OF HEADACHE- MAY REPEAT IN 1-2 HOURS IF NO RELIEF     No current facility-administered medications for this visit.    Allergies as of 01/20/2023   (No Known Allergies)    Vitals: BP 120/78   Pulse 70   Ht 4\' 10"  (1.473 m)   Wt 138 lb (62.6 kg)   BMI 28.84 kg/m  Last Weight:  Wt Readings from Last 1 Encounters:  01/20/23 138 lb (62.6 kg)   Last Height:   Ht Readings from Last 1 Encounters:  01/20/23 4\' 10"  (1.473 m)    Physical exam:  General: The patient is awake, alert and appears not in acute distress.  The patient is well groomed. Head: Normocephalic, atraumatic.  Neck is supple.   Neck circumference: Cardiovascular:  Regular rate and palpable peripheral pulse:  Respiratory: clear to auscultation.  Mallampati 1, Skin:  Without evidence of edema, or rash,  multiple tattooes to chest and ands and arms.  Trunk: patient has normal posture.   Neurologic exam : The patient is awake and alert, oriented to place and time.  Memory subjective  described as intact.  There is a normal attention span & concentration ability.  Speech is fluent without  dysarthria, dysphonia or aphasia.  Mood and affect are appropriate.  Cranial nerves: Pupils are equal and briskly reactive to light. Funduscopic exam without  evidence of pallor or edema. Extraocular movements  in vertical and horizontal planes intact and without nystagmus.  Visual fields by finger perimetry are intact. Hearing to finger  rub intact.  Facial sensation intact to fine touch. Facial motor strength is symmetric and tongue and uvula move midline.  Motor exam:   Normal tone and normal muscle bulk and symmetric normal strength in all extremities. Grip Strength equal  Proximal strength of shoulder muscles and hip flexors was intact .  Sensory:  Fine touch and vibration were tested . Proprioception was tested in the upper extremities only and was  normal.  Coordination: Rapid alternating movements in the fingers/hands were normal.  Finger-to-nose maneuver was tested and showed no evidence of ataxia, dysmetria or tremor.  Gait and station: Patient walked  without assistive device .  Core Strength within normal limits. Stance is stable and of normal base.    Deep tendon reflexes: in the  upper and lower extremities are symmetric   Assessment: Total time for face to face interview and examination, for review of  images and laboratory testing, neurophysiology testing and pre-existing records, including out-of -network , was 45 minutes. 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  There are migraines and tension headaches.  2) Migraine responds incompletely to sumatriptan, but she takes only 25 mg dose.  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 Not SUNCT.  CT Angio was negative.,  Nurtec worked faster , but wasn't stronger.  3) Tension headache is type 2 of headaches.   Advil didn't help, Tylenol was not helping.   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   Plan:  Treatment plan and additional workup planned after today includes:   1)  Steroid dose pack, relief of tension and migraine for temporary relief.  2)  MRI brain  3) starting prevention by daily topiramate 25 mg po. Can be used while breastfeeding. 4) increase sumatriptan to 100 mg.  No OTC analgesics for 3 weeks.  5)  Rv with headache journal in  3-5 months.   Melvyn Novas, MD

## 2023-01-20 NOTE — Patient Instructions (Addendum)
   Total time for face to face interview and examination, for review of  images and laboratory testing, neurophysiology testing and pre-existing records, including out-of -network , was 45 minutes. 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  There are migraines and tension headaches.  Had severe HTN peri and postpartum.  2) Migraine responds incompletely to sumatriptan, but she takes only 25 mg dose.  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 Not SUNCT.  CT Angio was negative.,  Nurtec worked faster , but wasn't stronger.  3) Tension headache is type 2 of headaches.   Advil didn't help, Tylenol was not helping.   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   Plan:  Treatment plan and additional workup planned after today includes:   1)  Steroid dose pack, relief of tension and migraine for temporary relief.  2)  MRI brain  3) starting prevention by daily topiramate 25 mg po. Can be used while breastfeeding. 4) increase sumatriptan to 100 mg.  No OTC analgesics for 3 weeks.  5)  Rv with headache journal in 3-5 months.   Melvyn Novas, MD

## 2023-01-23 ENCOUNTER — Telehealth: Payer: Self-pay | Admitting: Neurology

## 2023-01-23 NOTE — Telephone Encounter (Signed)
[facility] [place]: [id number] exp. 01/23/23-03/23/23 sent to [facility] [phone]

## 2023-01-27 ENCOUNTER — Ambulatory Visit (HOSPITAL_COMMUNITY)
Admission: RE | Admit: 2023-01-27 | Discharge: 2023-01-27 | Disposition: A | Discharge status: Home / Self Care | Payer: Medicaid Other | Source: Ambulatory Visit | Attending: Neurology | Admitting: Neurology

## 2023-01-27 DIAGNOSIS — Z8759 Personal history of other complications of pregnancy, childbirth and the puerperium: Secondary | ICD-10-CM | POA: Insufficient documentation

## 2023-01-27 DIAGNOSIS — Z8679 Personal history of other diseases of the circulatory system: Secondary | ICD-10-CM | POA: Diagnosis present

## 2023-01-27 DIAGNOSIS — G44211 Episodic tension-type headache, intractable: Secondary | ICD-10-CM | POA: Diagnosis present

## 2023-01-27 DIAGNOSIS — G43111 Migraine with aura, intractable, with status migrainosus: Secondary | ICD-10-CM | POA: Diagnosis present

## 2023-01-27 DIAGNOSIS — R270 Ataxia, unspecified: Secondary | ICD-10-CM | POA: Diagnosis present

## 2023-01-28 ENCOUNTER — Telehealth: Payer: Self-pay | Admitting: Neurology

## 2023-01-28 DIAGNOSIS — G43111 Migraine with aura, intractable, with status migrainosus: Secondary | ICD-10-CM

## 2023-01-28 MED ORDER — TOPIRAMATE 25 MG PO TABS: 25.0000 mg | ORAL_TABLET | Freq: Every day | ORAL | 1 refills | Status: DC

## 2023-01-28 NOTE — Telephone Encounter (Signed)
Pt called wanting to know if she was to have the topiramate 25 mg po sent to the [facility]

## 2023-05-14 ENCOUNTER — Ambulatory Visit: Payer: Medicaid Other | Admitting: Adult Health

## 2023-05-14 ENCOUNTER — Encounter: Payer: Self-pay | Admitting: Adult Health

## 2023-05-14 VITALS — BP 110/72 | HR 89 | Ht <= 58 in | Wt 151.5 lb

## 2023-05-14 DIAGNOSIS — Z30013 Encounter for initial prescription of injectable contraceptive: Secondary | ICD-10-CM | POA: Diagnosis not present

## 2023-05-14 DIAGNOSIS — Z3202 Encounter for pregnancy test, result negative: Secondary | ICD-10-CM | POA: Insufficient documentation

## 2023-05-14 LAB — POCT URINE PREGNANCY: Preg Test, Ur: NEGATIVE

## 2023-05-14 MED ORDER — MEDROXYPROGESTERONE ACETATE 150 MG/ML IM SUSP: 150.0000 mg | INTRAMUSCULAR | 4 refills | Status: AC

## 2023-05-14 MED ORDER — MEDROXYPROGESTERONE ACETATE 150 MG/ML IM SUSY
150.0000 mg | PREFILLED_SYRINGE | Freq: Once | INTRAMUSCULAR | Status: AC
  Administered 2023-05-14: 150 mg via INTRAMUSCULAR

## 2023-05-14 NOTE — Progress Notes (Signed)
  Subjective:     Patient ID: Theresa Lane, female   DOB: 2000/05/03, 23 y.o.   MRN: [id number]  HPI [name] is a 23 year old white female, single, G1P1001, in wanting to get on depo, is taking slynd. She is still breastfeeding 1 yo son.     Component Value Date/Time   DIAGPAP  11/25/2022 1448    - Negative for intraepithelial lesion or malignancy (NILM)   HPVHIGH Negative 11/25/2022 1448   ADEQPAP  11/25/2022 1448    Satisfactory for evaluation; transformation zone component PRESENT.    Review of Systems Still breastfeeding Had sex last night but taking slynd  Wants depo Reviewed past medical,surgical, social and family history. Reviewed medications and allergies.     Objective:   Physical Exam BP 110/72 (BP Location: Left Arm, Patient Position: Sitting, Cuff Size: Normal)   Pulse 89   Ht 4\' 10"  (1.473 m)   Wt 151 lb 8 oz (68.7 kg)   Breastfeeding Yes   BMI 31.66 kg/m  UPT is negative  Skin warm and dry. Lungs: clear to ausculation bilaterally. Cardiovascular: regular rate and rhythm.  Fall risk is low  [facility] - 05/14/23 1503       Pregnancy Intention Screening   Does the patient want to become pregnant in the next year? No    Does the patient's partner want to become pregnant in the next year? No    Would the patient like to discuss contraceptive options today? No      Contraception Wrap Up   Current Method Withdrawal or Other Method;Oral Contraceptive    End Method Hormonal Injection    Contraception Counseling Provided Yes    How was the end contraceptive method provided? Prescription   first dose in office today               Assessment:     1. Pregnancy examination or test, negative result - POCT urine pregnancy  2. Encounter for initial prescription of injectable contraceptive (Primary) Will give first dose in office today, has used depo in the past Can stop slynd now  Use condoms for 2 weeks Rx sent for depo Meds ordered this encounter   Medications   medroxyPROGESTERone (DEPO-PROVERA) 150 MG/ML injection    Sig: Inject 1 mL (150 mg total) into the muscle every 3 (three) months.    Dispense:  1 mL    Refill:  4    Supervising Provider:   Lazaro Arms [[id number]]       Plan:     Return in 12 weeks for depo

## 2023-05-14 NOTE — Addendum Note (Signed)
 Addended by: Colen Darling on: 05/14/2023 03:57 PM   Modules accepted: Orders

## 2023-05-14 NOTE — Patient Instructions (Signed)
 Use condoms for 2 weeks Next depo in 12 weeks

## 2023-07-01 ENCOUNTER — Encounter: Payer: Self-pay | Admitting: Neurology

## 2023-07-01 ENCOUNTER — Ambulatory Visit: Payer: Medicaid Other | Admitting: Neurology

## 2023-07-01 VITALS — BP 128/69 | HR 99 | Ht <= 58 in | Wt 156.8 lb

## 2023-07-01 DIAGNOSIS — G44211 Episodic tension-type headache, intractable: Secondary | ICD-10-CM | POA: Insufficient documentation

## 2023-07-01 DIAGNOSIS — G43111 Migraine with aura, intractable, with status migrainosus: Secondary | ICD-10-CM | POA: Insufficient documentation

## 2023-07-01 MED ORDER — TOPIRAMATE 25 MG PO TABS: 25.0000 mg | ORAL_TABLET | Freq: Two times a day (BID) | ORAL | 5 refills | Status: AC

## 2023-07-01 MED ORDER — NURTEC 75 MG PO TBDP: 75.0000 mg | ORAL_TABLET | ORAL | 2 refills | Status: DC | PRN

## 2023-07-01 NOTE — Progress Notes (Signed)
 Provider:  Melvyn Novas, MD  Primary Care Physician:    Referring Provider: No referring provider defined for this encounter.          Chief Complaint according to patient   Patient presents with:                HISTORY OF PRESENT ILLNESS:  Theresa Lane is a 23 y.o. female patient who is here for revisit 07/01/2023 for  :  Headache syndrome:   Reports she is doing better, not by headache frequency or quality but by intensity- the intensity is 4/ 10 and the headaches don't last all day.  4 days a week.  Not using sumatriptan or Nurtec currently , only OTC Advil.  Still breast feeding. Feeling faint some days. Reviewed headache journal: its not a spr for example on 01-21-2023 at 7:30 PM the patient took a sumatriptan for pain that had started at 7 PM and reached a level 4 out of 10 pain did not get worse and got actually little bit better after the pill this was a frontal headache the left side started first frontal and temporal then the right side followed no dizziness was a spell  01-22-2023 pain level was 5 or 6 out of 10 onset time 3:46 PM she took a sumatriptan at 3:50 PM but became dizzy the front hurts and she felt as if she had not slept in 2 days very sedated very fatigued.  She also felt that her face was puffy, and at 6:30 PM her mom told her to check up on blood pressure and it was 150/122.  She still felt somewhat dizzy blood pressure was then measured again at 8:20 PM 140/81 mmHg.  She took a cold shower for 5 minutes which helped with the dizziness and headaches but it did not resolve either.  11-7 24 the pain level was now 3 out of 10 she took a pill at 7:50 PM also the onset was already at 3:50 PM and this 1 was a shooting pain through the left temple she felt still somewhat dizzy almost fainting blood pressure was normal she took a sumatriptan and she went to bed.  So for the entry for 11-8, 11 9, but there was a daily headache through the months of  November by December the pain entries were every other day 12 1, 12 4, 12 5, 02/21/2012 and 17, the pain level was 5 on average and there was no headache entry between 1219 and January saphenous.  In January much less frequent entries and the same for February.  Apparently only Advil taken and topiramate as a medical prevention.     I see a positive trend in frequency as well as in quality of pain- would continue Topiramate, I like to reduce advil to 3 a week., she has alternated with excedrin.   Nurtec refilled.    Keeps hydrating with water.    Reduced caffeine intake to one a day from 5 a day.    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  There are migraines and tension headaches.  Had severe HTN peri and postpartum.  2) Migraine responds incompletely to sumatriptan, but she takes only 25 mg dose.  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 Not SUNCT.  CT Angio was negative.,  Nurtec worked faster , but wasn't stronger.  3) Tension headache is type  2 of headaches.   Advil didn't help, Tylenol was not helping.    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    Plan:  Treatment plan and additional workup planned after today includes:    1)  Steroid dose pack, relief of tension and migraine for temporary relief.  2)  MRI brain  3) starting prevention by daily topiramate 25 mg po. Can be used while breastfeeding. 4) increase sumatriptan to 100 mg.  No OTC analgesics for 3 weeks.  5)  Rv with headache journal in 3-5 months.    Theresa Novas, MD       .  Theresa Lane is a 23 y.o. female and seen here upon referral from FNP Theresa Lane for a Consultation/ Evaluation of migraine headaches.     This patient reports onset of Severe and frequent migraine headaches after the birth of her child one day after her 21th Birthday .   Most migraines affect the left side, retro-orbital pain, nausea - even vomiting -but  no photophobia.  Sweet smells can be a trigger.   Some times pain radiates to the temple and nape of the neck. Other headaches are described as a vice around the neck, squeezing her.  She reports sometimes been woken by acute headaches, sometimes they escalate over the day. No r trigger by foods, sleep habits or hydration.    Over the months since onset the longest headache free time a has been a week.  15 migraines a months ,and sometimes lasting many days, at least 24 ours- status.  Some floaters announce the migraines.   Sometimes she feels dizziness and a heaviness.   Triggers for headaches have been hot weather and she reports minor  effect of hot or cold packs.    She changed from a depot birth control to a oral hormone , no effect.   She still breast feeds baby Theresa Lane .     Review of Systems: Out of a complete 14 system review, the patient complains of only the following symptoms, and all other reviewed systems are negative.   Epworth Sleepiness score: na   More headache days than not.     Social History   Socioeconomic History   Marital status: Single    Spouse name: Not on file   Number of children: Not on file   Years of education: Not on file   Highest education level: Not on file  Occupational History   Not on file  Tobacco Use   Smoking status: Never   Smokeless tobacco: Never  Vaping Use   Vaping status: Former  Substance and Sexual Activity   Alcohol use: Yes    Comment: rarely   Drug use: Never   Sexual activity: Yes    Birth control/protection: Other-see comments, Injection    Comment: pull out  Other Topics Concern   Not on file  Social History Narrative   Not on file   Social Drivers of Health   Financial Resource Strain: Low Risk  (10/23/2021)   Overall Financial Resource Strain (CARDIA)    Difficulty of Paying Living Expenses: Not hard at all  Food Insecurity: No Food Insecurity (10/23/2021)   Hunger Vital Sign    Worried About Running Out of  Food in the Last Year: Never true    Ran Out of Food in the Last Year: Never true  Transportation Needs: No Transportation Needs (10/23/2021)   PRAPARE - Administrator, Civil Service (Medical): No  Lack of Transportation (Non-Medical): No  Physical Activity: Inactive (10/23/2021)   Exercise Vital Sign    Days of Exercise per Week: 0 days    Minutes of Exercise per Session: 0 min  Stress: No Stress Concern Present (10/23/2021)   [employer] of Occupational Health - Occupational Stress Questionnaire    Feeling of Stress : Not at all  Social Connections: Moderately Isolated (10/23/2021)   Social Connection and Isolation Panel [NHANES]    Frequency of Communication with Friends and Family: Three times a week    Frequency of Social Gatherings with Friends and Family: Twice a week    Attends Religious Services: Never    Database administrator or Organizations: No    Attends Engineer, structural: Never    Marital Status: Living with partner    Family History  Problem Relation Age of Onset   Healthy Father    Asthma Mother     Past Medical History:  Diagnosis Date   Anemia    Medical history non-contributory    Migraines     Past Surgical History:  Procedure Laterality Date   NO PAST SURGERIES       Current Outpatient Medications on File Prior to Visit  Medication Sig Dispense Refill   medroxyPROGESTERone (DEPO-PROVERA) 150 MG/ML injection Inject 1 mL (150 mg total) into the muscle every 3 (three) months. 1 mL 4   SUMAtriptan (IMITREX) 100 MG tablet Take 1 tablet (100 mg total) by mouth once as needed for up to 1 dose for migraine. May repeat in 2 hours if headache persists or recurs. 2 tablet 2   No current facility-administered medications on file prior to visit.    No Known Allergies   DIAGNOSTIC DATA (LABS, IMAGING, TESTING) - I reviewed patient records, labs, notes, testing and imaging myself where available.  Lab Results  Component Value Date    WBC 8.2 02/27/2022   HGB 12.6 02/27/2022   HCT 38.2 02/27/2022   MCV 80.3 02/27/2022   PLT 347 02/27/2022      Component Value Date/Time   NA 139 02/27/2022 2011   K 4.1 02/27/2022 2011   CL 108 02/27/2022 2011   CO2 21 (L) 02/27/2022 2011   GLUCOSE 86 02/27/2022 2011   BUN 16 02/27/2022 2011   CREATININE 0.71 02/27/2022 2011   CALCIUM 9.1 02/27/2022 2011   PROT 6.6 01/23/2021 1441   ALBUMIN 3.6 01/23/2021 1441   AST 13 (L) 01/23/2021 1441   ALT 12 01/23/2021 1441   ALKPHOS 47 01/23/2021 1441   BILITOT 0.5 01/23/2021 1441   GFRNONAA >60 02/27/2022 2011   No results found for: "CHOL", "HDL", "LDLCALC", "LDLDIRECT", "TRIG", "CHOLHDL" No results found for: "HGBA1C" No results found for: "VITAMINB12" No results found for: "TSH"  PHYSICAL EXAM:  Today's Vitals   07/01/23 1419  BP: 128/69  Pulse: 99  Weight: 156 lb 12.8 oz (71.1 kg)  Height: 4\' 10"  (1.473 m)   Body mass index is 32.77 kg/m.   Wt Readings from Last 3 Encounters:  07/01/23 156 lb 12.8 oz (71.1 kg)  05/14/23 151 lb 8 oz (68.7 kg)  01/20/23 138 lb (62.6 kg)     Ht Readings from Last 3 Encounters:  07/01/23 4\' 10"  (1.473 m)  05/14/23 4\' 10"  (1.473 m)  01/20/23 4\' 10"  (1.473 m)       The patient is awake, alert and appears not in acute distress.  The patient is well groomed. Head: Normocephalic, atraumatic.  Neck is  supple.   Neck circumference: Cardiovascular:  Regular rate and palpable peripheral pulse:  Respiratory: clear to auscultation.  Mallampati 1, Skin:  Without evidence of edema, or rash,  multiple tattooes to chest and ands and arms.  Trunk: patient has normal posture.     Neurologic exam : The patient is awake and alert, oriented to place and time.  Memory subjective  described as intact.  There is a normal attention span & concentration ability.  Speech is fluent without  dysarthria, dysphonia or aphasia.  Mood and affect are appropriate.   Cranial nerves: Pupils are equal  and briskly reactive to light. Funduscopic exam without  evidence of pallor or edema. Extraocular movements  in vertical and horizontal planes intact and without nystagmus.  Visual fields by finger perimetry are intact. Hearing to finger rub intact.  Facial sensation intact to fine touch. Facial motor strength is symmetric and tongue and uvula move midline.   Motor exam:   Normal tone and normal muscle bulk and symmetric normal strength in all extremities. Grip Strength equal  Proximal strength of shoulder muscles and hip flexors was intact .   Sensory:  Fine touch and vibration were tested . Proprioception was tested in the upper extremities only and was  normal.   Coordination: Rapid alternating movements in the fingers/hands were normal.  Finger-to-nose maneuver was tested and showed no evidence of ataxia, dysmetria or tremor.   Gait and station: Patient walked without assistive device .  Core Strength within normal limits. Stance is stable and of normal base.      Deep tendon reflexes: in the  upper and lower extremities are symmetric     ASSESSMENT AND PLAN 23 y.o. 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   Continue hydrating well, reduce caffeine, get 8 hours of sleep a day.  Nurtec refilled,for acute use.  Can be continued by PCP  Topiramate refilled daily 25 mg bid for  prevention.  This can be taken over by PCP   Please establish a Primary Care Physician  and keep us  posted about ENT and GI  work up which is scheduled for the next 3 months.   RV prn with NP.         Neomia Banner, MD  [facility] Board certified by The [employer] of Sleep Medicine and Diplomate of the [employer] of Sleep Medicine. Board certified In Neurology through the ABPN, Fellow of the [employer] of Neurology.     After  spending a total time of  23  minutes face to face and additional time for physical and neurologic examination, review of laboratory studies,  personal review of imaging studies, reports and results of other testing and review of referral information / records as far as provided in visit,   Electronically signed by: Neomia Banner, MD 07/01/2023 2:46 PM  [facility] Board certified by The [employer] of Sleep Medicine and Diplomate of the [employer] of Sleep Medicine. Board certified In Neurology through the ABPN, Fellow of the [employer] of Neurology.

## 2023-07-09 ENCOUNTER — Other Ambulatory Visit (HOSPITAL_COMMUNITY): Payer: Self-pay

## 2023-07-09 ENCOUNTER — Telehealth: Payer: Self-pay

## 2023-07-09 NOTE — Telephone Encounter (Signed)
 I am working on a PA for [employer]	[place], PT has Medicaid and they require the PT to have tried and failed TWO Triptans before they will approve Nurtec,unless PT has a contraindication to Triptans.  The two preferred are Sumatriptan  and Rizatriptan.I only see record of Sumatriptan  in the chart-Please advise-    Key: [facility]

## 2023-07-21 MED ORDER — RIZATRIPTAN BENZOATE 10 MG PO TABS: 10.0000 mg | ORAL_TABLET | ORAL | 0 refills | Status: DC | PRN

## 2023-07-21 NOTE — Telephone Encounter (Signed)
 Pt called to get the name of the medication sent to the pharmacy todayl.

## 2023-07-21 NOTE — Telephone Encounter (Addendum)
 Called the patient to advise that the insurance recommends she try another triptan first before they will cover nurtec. There was no answer. LVM advising of the denial for the medication and to try another triptan. Triptan was called to the pharmacy for the pt.

## 2023-07-21 NOTE — Addendum Note (Signed)
 Addended by: Elton Ham on: 07/21/2023 02:22 PM   Modules accepted: Orders

## 2023-07-23 NOTE — Telephone Encounter (Signed)
 Tpoamax and Maxalt were ordered,  NURTEC needed pre authorization.

## 2023-07-25 ENCOUNTER — Other Ambulatory Visit: Payer: Self-pay

## 2023-07-25 ENCOUNTER — Encounter (HOSPITAL_BASED_OUTPATIENT_CLINIC_OR_DEPARTMENT_OTHER): Payer: Self-pay | Admitting: Otolaryngology

## 2023-07-25 NOTE — H&P (Signed)
 HPI:   Theresa Lane is a 23 y.o. female who presents as a consult Patient.   Referring Provider: Jackquelyn Mass, MD  Chief complaint: Cervical lymph nodes.  HPI: While being worked up for migraines that she had a CT and an MRI. Intracranial pathology was not identified but bilateral cervical lymph nodes were identified worse on the left. Otherwise fairly healthy.  PMH/Meds/All/SocHx/FamHx/ROS:   Past Medical History:  Diagnosis Date  Back pain 01/2018  Constipation  Leg pain 01/2018  Nausea  Vomiting  Weight loss   Past Surgical History:  Procedure Laterality Date  NO PAST SURGERIES  Procedure: NO PAST SURGERIES   No family history of bleeding disorders, wound healing problems or difficulty with anesthesia.     Current Outpatient Medications:  ondansetron  (ZOFRAN ) 4 mg tablet, Take 1 tablet by mouth in the morning and 1 tablet at noon and 1 tablet in the evening. Take with meals., Disp: , Rfl:  SUMAtriptan  (IMITREX ) 25 mg tablet, TAKE 1 TABLET BY MOUTH AT LEAST 2 HOURS BETWEEN DOSES AS NEEDED TWICE A DAY FOR 30 DAYS, Disp: , Rfl:  topiramate  (TOPAMAX ) 25 mg tablet, Take 25 mg by mouth daily., Disp: , Rfl:  ergocalciferol (VITAMIN D2) 1,250 mcg (50,000 unit) capsule, Take 50,000 Units by mouth every 7 days for 12 doses., Disp: 12 capsule, Rfl: 0  A complete ROS was performed with pertinent positives/negatives noted in the HPI. The remainder of the ROS are negative.   Physical Exam:   Temp 97.4 F (36.3 C) (Temporal)  Ht 1.49 m (4' 10.66")  Wt 67.2 kg (148 lb 3.2 oz)  BMI 30.28 kg/m   General: Healthy and alert, in no distress, breathing easily. Normal affect. In a pleasant mood. Head: Normocephalic, atraumatic. No masses, or scars. Eyes: Pupils are equal, and reactive to light. Vision is grossly intact. No spontaneous or gaze nystagmus. Ears: Ear canals are clear. Tympanic membranes are intact, with normal landmarks and the middle ears are clear and  healthy. Hearing: Grossly normal. Nose: Nasal cavities are clear with healthy mucosa, no polyps or exudate. Airways are patent. Face: No masses or scars, facial nerve function is symmetric. Oral Cavity: No mucosal abnormalities are noted. Tongue with normal mobility. Dentition appears healthy. Oropharynx: Tonsils are symmetric. There are no mucosal masses identified. Tongue base appears normal and healthy. Larynx/Hypopharynx: deferred Chest: Deferred Neck: She is a little bit tender in the left level 2 but there is no distinctly palpable masses, no cervical adenopathy, no thyroid nodules or enlargement. Neuro: Cranial nerves II-XII with normal function. Balance: Normal gate. Other findings: none.  Independent Review of Additional Tests or Records:  CT angio of neck:  IMPRESSION:  1. Negative CTA of the head and neck, no explanation for headache.  2. Enlarged lymph nodes in the neck without visible underlying  pharyngitis. Correlate for URI symptoms although the nodes are  asymmetrically larger on the left, recommend follow-up to exclude  granulomatous or lymphoproliferative disease.   Procedures:  none  Impression & Plans:  Bilateral cervical lymphadenitis. Recommend a round of antibiotic therapy to see if this will resolve. If not then we may need to repeat imaging and possibly FNA.

## 2023-07-28 ENCOUNTER — Ambulatory Visit (HOSPITAL_BASED_OUTPATIENT_CLINIC_OR_DEPARTMENT_OTHER): Admitting: Certified Registered Nurse Anesthetist

## 2023-07-28 ENCOUNTER — Other Ambulatory Visit: Payer: Self-pay

## 2023-07-28 ENCOUNTER — Encounter (HOSPITAL_BASED_OUTPATIENT_CLINIC_OR_DEPARTMENT_OTHER)
Admission: RE | Disposition: A | Discharge status: Home / Self Care | Payer: Self-pay | Source: Home / Self Care | Attending: Otolaryngology

## 2023-07-28 ENCOUNTER — Ambulatory Visit (HOSPITAL_BASED_OUTPATIENT_CLINIC_OR_DEPARTMENT_OTHER)
Admission: RE | Admit: 2023-07-28 | Discharge: 2023-07-28 | Disposition: A | Discharge status: Home / Self Care | Attending: Otolaryngology | Admitting: Otolaryngology

## 2023-07-28 ENCOUNTER — Encounter (HOSPITAL_BASED_OUTPATIENT_CLINIC_OR_DEPARTMENT_OTHER): Payer: Self-pay | Admitting: Otolaryngology

## 2023-07-28 DIAGNOSIS — R590 Localized enlarged lymph nodes: Secondary | ICD-10-CM | POA: Insufficient documentation

## 2023-07-28 DIAGNOSIS — Z01818 Encounter for other preprocedural examination: Secondary | ICD-10-CM

## 2023-07-28 DIAGNOSIS — G43909 Migraine, unspecified, not intractable, without status migrainosus: Secondary | ICD-10-CM | POA: Diagnosis not present

## 2023-07-28 HISTORY — DX: Nonspecific lymphadenitis, unspecified: I88.9

## 2023-07-28 LAB — POCT PREGNANCY, URINE: Preg Test, Ur: NEGATIVE

## 2023-07-28 SURGERY — LYMPH NODE BIOPSY
Anesthesia: General | Laterality: Left

## 2023-07-28 MED ORDER — PROPOFOL 10 MG/ML IV BOLUS
INTRAVENOUS | Status: DC | PRN
  Administered 2023-07-28: 160 ug via INTRAVENOUS

## 2023-07-28 MED ORDER — DEXAMETHASONE SODIUM PHOSPHATE 10 MG/ML IJ SOLN
INTRAMUSCULAR | Status: DC | PRN
  Administered 2023-07-28: 10 mg via INTRAVENOUS

## 2023-07-28 MED ORDER — HYDROCODONE-ACETAMINOPHEN 5-325 MG PO TABS: 1.0000 | ORAL_TABLET | Freq: Four times a day (QID) | ORAL | 0 refills | Status: AC | PRN

## 2023-07-28 MED ORDER — FENTANYL CITRATE (PF) 100 MCG/2ML IJ SOLN
INTRAMUSCULAR | Status: DC | PRN
Start: 2023-07-28 — End: 2023-07-28
  Administered 2023-07-28: 100 ug via INTRAVENOUS

## 2023-07-28 MED ORDER — OXYMETAZOLINE HCL 0.05 % NA SOLN
NASAL | Status: AC
  Filled 2023-07-28: qty 30

## 2023-07-28 MED ORDER — MIDAZOLAM HCL 2 MG/2ML IJ SOLN
INTRAMUSCULAR | Status: AC
  Filled 2023-07-28: qty 2

## 2023-07-28 MED ORDER — MIDAZOLAM HCL 2 MG/2ML IJ SOLN
INTRAMUSCULAR | Status: DC | PRN
  Administered 2023-07-28: 2 mg via INTRAVENOUS

## 2023-07-28 MED ORDER — ACETAMINOPHEN 500 MG PO TABS
ORAL_TABLET | ORAL | Status: AC
  Filled 2023-07-28: qty 2

## 2023-07-28 MED ORDER — PROPOFOL 10 MG/ML IV BOLUS
INTRAVENOUS | Status: AC
  Filled 2023-07-28: qty 20

## 2023-07-28 MED ORDER — LIDOCAINE-EPINEPHRINE 1 %-1:100000 IJ SOLN
INTRAMUSCULAR | Status: AC
  Filled 2023-07-28: qty 1

## 2023-07-28 MED ORDER — ONDANSETRON HCL 4 MG/2ML IJ SOLN
INTRAMUSCULAR | Status: DC | PRN
  Administered 2023-07-28: 4 mg via INTRAVENOUS

## 2023-07-28 MED ORDER — FENTANYL CITRATE (PF) 100 MCG/2ML IJ SOLN
25.0000 ug | INTRAMUSCULAR | Status: DC | PRN
  Administered 2023-07-28: 25 ug via INTRAVENOUS

## 2023-07-28 MED ORDER — LACTATED RINGERS IV SOLN: INTRAVENOUS | Status: DC

## 2023-07-28 MED ORDER — LIDOCAINE 2% (20 MG/ML) 5 ML SYRINGE
INTRAMUSCULAR | Status: DC | PRN
  Administered 2023-07-28: 60 mg via INTRAVENOUS

## 2023-07-28 MED ORDER — ONDANSETRON 4 MG PO TBDP: 4.0000 mg | ORAL_TABLET | Freq: Three times a day (TID) | ORAL | 0 refills | Status: AC | PRN

## 2023-07-28 MED ORDER — ACETAMINOPHEN 500 MG PO TABS
1000.0000 mg | ORAL_TABLET | Freq: Once | ORAL | Status: AC
  Administered 2023-07-28: 1000 mg via ORAL

## 2023-07-28 MED ORDER — FENTANYL CITRATE (PF) 100 MCG/2ML IJ SOLN
INTRAMUSCULAR | Status: AC
  Filled 2023-07-28: qty 2

## 2023-07-28 SURGICAL SUPPLY — 44 items
BAND RUBBER #18 3X1/16 STRL (MISCELLANEOUS) IMPLANT
BENZOIN TINCTURE PRP APPL 2/3 (GAUZE/BANDAGES/DRESSINGS) IMPLANT
BLADE SURG 15 STRL LF DISP TIS (BLADE) ×1 IMPLANT
CANISTER SUCT 1200ML W/VALVE (MISCELLANEOUS) ×1 IMPLANT
CLEANER CAUTERY TIP PAD (MISCELLANEOUS) ×1 IMPLANT
CLIP TI MEDIUM 6 (CLIP) IMPLANT
CLIP TI WIDE RED SMALL 6 (CLIP) IMPLANT
CORD BIPOLAR FORCEPS 12FT (ELECTRODE) IMPLANT
COVER BACK TABLE 60X90IN (DRAPES) ×1 IMPLANT
COVER MAYO STAND STRL (DRAPES) ×1 IMPLANT
DERMABOND ADVANCED .7 DNX12 (GAUZE/BANDAGES/DRESSINGS) IMPLANT
DRAIN 10X20 FULL PER LF SIL ST (DRAIN) IMPLANT
DRAIN PENROSE 12X.25 LTX STRL (MISCELLANEOUS) IMPLANT
DRAPE U-SHAPE 76X120 STRL (DRAPES) ×1 IMPLANT
ELECT COATED BLADE 2.86 ST (ELECTRODE) ×1 IMPLANT
ELECTRODE REM PT RTRN 9FT ADLT (ELECTROSURGICAL) ×1 IMPLANT
EVACUATOR SILICONE 100CC (DRAIN) IMPLANT
FORCEPS BIPOLAR SPETZLER 8 1.0 (NEUROSURGERY SUPPLIES) IMPLANT
GAUZE 4X4 16PLY ~~LOC~~+RFID DBL (SPONGE) IMPLANT
GAUZE SPONGE 2X2 STRL 8-PLY (GAUZE/BANDAGES/DRESSINGS) IMPLANT
GAUZE SPONGE 4X4 12PLY STRL LF (GAUZE/BANDAGES/DRESSINGS) IMPLANT
GLOVE ECLIPSE 7.5 STRL STRAW (GLOVE) ×1 IMPLANT
GOWN STRL REUS W/ TWL LRG LVL3 (GOWN DISPOSABLE) ×1 IMPLANT
GOWN STRL REUS W/ TWL XL LVL3 (GOWN DISPOSABLE) ×1 IMPLANT
NDL PRECISIONGLIDE 27X1.5 (NEEDLE) IMPLANT
NEEDLE PRECISIONGLIDE 27X1.5 (NEEDLE) IMPLANT
NS IRRIG 1000ML POUR BTL (IV SOLUTION) IMPLANT
PACK BASIN DAY SURGERY FS (CUSTOM PROCEDURE TRAY) ×1 IMPLANT
PENCIL FOOT CONTROL (ELECTRODE) ×1 IMPLANT
STRIP CLOSURE SKIN 1/2X4 (GAUZE/BANDAGES/DRESSINGS) IMPLANT
SUCTION TUBE FRAZIER 10FR DISP (SUCTIONS) IMPLANT
SUT CHROMIC 3 0 PS 2 (SUTURE) IMPLANT
SUT CHROMIC 4 0 P 3 18 (SUTURE) IMPLANT
SUT ETHILON 4 0 PS 2 18 (SUTURE) IMPLANT
SUT NYLON ETHILON 5-0 P-3 1X18 (SUTURE) IMPLANT
SUT SILK 3-0 18XBRD TIE BLK (SUTURE) ×1 IMPLANT
SUT SILK 4 0 TIES 17X18 (SUTURE) IMPLANT
SUT VIC AB 4-0 PS2 18 (SUTURE) IMPLANT
SUT VICRYL RAPIDE 4-0 (SUTURE) IMPLANT
SYR BULB EAR ULCER 3OZ GRN STR (SYRINGE) IMPLANT
SYR CONTROL 10ML LL (SYRINGE) ×1 IMPLANT
TOWEL GREEN STERILE FF (TOWEL DISPOSABLE) ×1 IMPLANT
TRAY DSU PREP LF (CUSTOM PROCEDURE TRAY) ×1 IMPLANT
TUBE CONNECTING 20X1/4 (TUBING) ×1 IMPLANT

## 2023-07-28 NOTE — Transfer of Care (Signed)
 Immediate Anesthesia Transfer of Care Note  Patient: Theresa Lane  Procedure(s) Performed: LYMPH NODE BIOPSY (Left)  Patient Location: PACU  Anesthesia Type:General  Level of Consciousness: drowsy  Airway & Oxygen Therapy: Patient Spontanous Breathing and Patient connected to face mask oxygen  Post-op Assessment: Report given to RN and Post -op Vital signs reviewed and stable  Post vital signs: Reviewed and stable  Last Vitals:  Vitals Value Taken Time  BP 115/76 07/28/23 1133  Temp    Pulse 84 07/28/23 1136  Resp 29 07/28/23 1136  SpO2 99 % 07/28/23 1136  Vitals shown include unfiled device data.  Last Pain:  Vitals:   07/28/23 0903  TempSrc: Tympanic  PainSc: 0-No pain         Complications: No notable events documented.

## 2023-07-28 NOTE — Interval H&P Note (Signed)
 History and Physical Interval Note:  07/28/2023 10:15 AM  Theresa Lane  has presented today for surgery, with the diagnosis of Cervical lymphadenitis.  The various methods of treatment have been discussed with the patient and family. After consideration of risks, benefits and other options for treatment, the patient has consented to  Procedure(s) with comments: LYMPH NODE BIOPSY (Left) - Excisional biopsy left cervical lymph node. as a surgical intervention.  The patient's history has been reviewed, patient examined, no change in status, stable for surgery.  I have reviewed the patient's chart and labs.  Questions were answered to the patient's satisfaction.     [name]

## 2023-07-28 NOTE — Anesthesia Preprocedure Evaluation (Addendum)
 Anesthesia Evaluation  Patient identified by MRN, date of birth, ID band Patient awake    Reviewed: Allergy & Precautions, H&P , NPO status , Patient's Chart, lab work & pertinent test results  Airway Mallampati: II  TM Distance: >3 FB Neck ROM: Full    Dental no notable dental hx. (+) Teeth Intact, Dental Advisory Given   Pulmonary neg pulmonary ROS   Pulmonary exam normal breath sounds clear to auscultation       Cardiovascular negative cardio ROS  Rhythm:Regular Rate:Normal     Neuro/Psych  Headaches  negative psych ROS   GI/Hepatic negative GI ROS, Neg liver ROS,,,  Endo/Other  negative endocrine ROS    Renal/GU negative Renal ROS  negative genitourinary   Musculoskeletal   Abdominal   Peds  Hematology  (+) Blood dyscrasia, anemia   Anesthesia Other Findings   Reproductive/Obstetrics negative OB ROS                             Anesthesia Physical Anesthesia Plan  ASA: 2  Anesthesia Plan: General   Post-op Pain Management: Tylenol  PO (pre-op)*   Induction: Intravenous  PONV Risk Score and Plan: 4 or greater and Ondansetron , Dexamethasone and Midazolam  Airway Management Planned: Oral ETT  Additional Equipment:   Intra-op Plan:   Post-operative Plan: Extubation in OR  Informed Consent: I have reviewed the patients History and Physical, chart, labs and discussed the procedure including the risks, benefits and alternatives for the proposed anesthesia with the patient or authorized representative who has indicated his/her understanding and acceptance.     Dental advisory given  Plan Discussed with: CRNA  Anesthesia Plan Comments:        Anesthesia Quick Evaluation

## 2023-07-28 NOTE — Anesthesia Postprocedure Evaluation (Signed)
 Anesthesia Post Note  Patient: Theresa Lane  Procedure(s) Performed: LYMPH NODE BIOPSY (Left)     Patient location during evaluation: PACU Anesthesia Type: General Level of consciousness: awake and alert Pain management: pain level controlled Vital Signs Assessment: post-procedure vital signs reviewed and stable Respiratory status: spontaneous breathing, nonlabored ventilation, respiratory function stable and patient connected to nasal cannula oxygen Cardiovascular status: blood pressure returned to baseline and stable Postop Assessment: no apparent nausea or vomiting Anesthetic complications: no  No notable events documented.  Last Vitals:  Vitals:   07/28/23 1215 07/28/23 1239  BP: 102/62 109/60  Pulse: 71 70  Resp: 16 14  Temp:  36.4 C  SpO2: 97% 96%    Last Pain:  Vitals:   07/28/23 1239  TempSrc: Temporal  PainSc: 1                  Willian Harrow

## 2023-07-28 NOTE — Op Note (Signed)
 OPERATIVE REPORT  DATE OF SURGERY: 07/28/2023  PATIENT:  Theresa Lane,  23 y.o. female  PRE-OPERATIVE DIAGNOSIS:  Cervical lymphadenopathy  POST-OPERATIVE DIAGNOSIS:  Cervical lymphadenopathy  PROCEDURE:  Procedure(s): LYMPH NODE BIOPSY, cervical, excisional, left   SURGEON:  Shermon Divine, MD  ASSISTANTS: None  ANESTHESIA:   General   EBL: 20 ml  DRAINS: None  LOCAL MEDICATIONS USED:  None  SPECIMEN: Left cervical lymph node, sent fresh for lymphoma workup  COUNTS:  Correct  PROCEDURE DETAILS: The patient was taken to the operating room and placed on the operating table in the supine position. Following induction of general endotracheal anesthesia, using laryngeal mask airway, the left neck was prepped and draped in standard fashion.  Palpable lymph node was identified mid level 5.  A transverse incision was outlined with a marking pen overlying this area.  Electrocautery was used to incise the skin and subcutaneous tissue.  Careful blunt dissection through the fascia developed just anterior to the spinal accessory nerve revealed a lymph node that was approximately 2 cm in greatest dimension.  This was dissected free of surrounding tissue and sent fresh for lymphoma workup.  The wound was irrigated with saline.  Hemostasis was completed with a 4-0 silk tie and bipolar cautery.  The wound was closed in layers using interrupted 3-0 chromic on the deep layer and running subcuticular layer.  Dermabond was used on the skin.  The patient was awakened extubated and transferred to recovery in stable condition.    PATIENT DISPOSITION:  To PACU, stable

## 2023-07-28 NOTE — Discharge Instructions (Signed)
  Post Anesthesia Home Care Instructions  Activity: Get plenty of rest for the remainder of the day. A responsible individual must stay with you for 24 hours following the procedure.  For the next 24 hours, DO NOT: -Drive a car -Advertising copywriter -Drink alcoholic beverages -Take any medication unless instructed by your physician -Make any legal decisions or sign important papers.  Meals: Start with liquid foods such as gelatin or soup. Progress to regular foods as tolerated. Avoid greasy, spicy, heavy foods. If nausea and/or vomiting occur, drink only clear liquids until the nausea and/or vomiting subsides. Call your physician if vomiting continues.  Special Instructions/Symptoms: Your throat may feel dry or sore from the anesthesia or the breathing tube placed in your throat during surgery. If this causes discomfort, gargle with warm salt water. The discomfort should disappear within 24 hours.  If you had a scopolamine patch placed behind your ear for the management of post- operative nausea and/or vomiting:  1. The medication in the patch is effective for 72 hours, after which it should be removed.  Wrap patch in a tissue and discard in the trash. Wash hands thoroughly with soap and water. 2. You may remove the patch earlier than 72 hours if you experience unpleasant side effects which may include dry mouth, dizziness or visual disturbances. 3. Avoid touching the patch. Wash your hands with soap and water after contact with the patch.    Next dose of tylenol  if needed is after 3pm

## 2023-07-28 NOTE — Anesthesia Procedure Notes (Signed)
 Procedure Name: LMA Insertion Date/Time: 07/28/2023 10:51 AM  Performed by: Steffani Edman, CRNAPre-anesthesia Checklist: Patient identified, Emergency Drugs available, Suction available and Patient being monitored Patient Re-evaluated:Patient Re-evaluated prior to induction Oxygen Delivery Method: Circle System Utilized Preoxygenation: Pre-oxygenation with 100% oxygen Induction Type: IV induction Ventilation: Mask ventilation without difficulty LMA: LMA inserted LMA Size: 4.0 Number of attempts: 1 Placement Confirmation: positive ETCO2 Tube secured with: Tape Dental Injury: Teeth and Oropharynx as per pre-operative assessment

## 2023-07-29 ENCOUNTER — Encounter (HOSPITAL_BASED_OUTPATIENT_CLINIC_OR_DEPARTMENT_OTHER): Payer: Self-pay | Admitting: Otolaryngology

## 2023-07-30 LAB — SURGICAL PATHOLOGY

## 2023-08-04 LAB — SURGICAL PATHOLOGY

## 2023-08-06 ENCOUNTER — Ambulatory Visit: Payer: Medicaid Other

## 2023-08-06 DIAGNOSIS — Z3042 Encounter for surveillance of injectable contraceptive: Secondary | ICD-10-CM | POA: Diagnosis not present

## 2023-08-06 MED ORDER — MEDROXYPROGESTERONE ACETATE 150 MG/ML IM SUSY
150.0000 mg | PREFILLED_SYRINGE | Freq: Once | INTRAMUSCULAR | Status: AC
  Administered 2023-08-06: 150 mg via INTRAMUSCULAR

## 2023-08-06 NOTE — Progress Notes (Signed)
   NURSE VISIT- INJECTION  SUBJECTIVE:  Theresa Lane is a 23 y.o. G66P1001 female here for a Depo Provera  for contraception/period management. She is a GYN patient.   OBJECTIVE:  There were no vitals taken for this visit.  Appears well, in no apparent distress  Injection administered in: Left deltoid  Meds ordered this encounter  Medications   medroxyPROGESTERone  Acetate SUSY 150 mg    ASSESSMENT: GYN patient Depo Provera  for contraception/period management PLAN: Follow-up: in 11-13 weeks for next Depo   Alyssa Jumper  08/06/2023 2:35 PM

## 2023-10-29 ENCOUNTER — Ambulatory Visit

## 2023-10-29 DIAGNOSIS — Z3042 Encounter for surveillance of injectable contraceptive: Secondary | ICD-10-CM

## 2023-10-29 MED ORDER — MEDROXYPROGESTERONE ACETATE 150 MG/ML IM SUSY
150.0000 mg | PREFILLED_SYRINGE | Freq: Once | INTRAMUSCULAR | Status: AC
Start: 2023-10-29 — End: 2023-10-29
  Administered 2023-10-29 (×2): 150 mg via INTRAMUSCULAR

## 2023-10-29 NOTE — Progress Notes (Signed)
   NURSE VISIT- INJECTION  SUBJECTIVE:  Theresa Lane is a 23 y.o. G43P1001 female here for a Depo Provera  for contraception/period management. She is a GYN patient.   OBJECTIVE:  There were no vitals taken for this visit.  Appears well, in no apparent distress  Injection administered in: Left arm  Meds ordered this encounter  Medications   medroxyPROGESTERone  Acetate SUSY 150 mg    ASSESSMENT: GYN patient Depo Provera  for contraception/period management PLAN: Follow-up: in 11-13 weeks for next Depo   Aleck FORBES Blase  10/29/2023 11:17 AM

## 2023-12-10 ENCOUNTER — Other Ambulatory Visit: Payer: Self-pay

## 2023-12-10 MED ORDER — RIZATRIPTAN BENZOATE 10 MG PO TABS: 10.0000 mg | ORAL_TABLET | ORAL | 0 refills | Status: AC | PRN

## 2023-12-10 NOTE — Telephone Encounter (Signed)
 Last filled by patient on 07/24/23 Last office visit : 07/01/23 next office visit has not been sheduled  Next office visit : per 07/01/23 note RV prn with NP Nurtec was not covered by the patient's insurance and patient was prescribed rizatriptan  as an alternative.

## 2023-12-16 ENCOUNTER — Ambulatory Visit (INDEPENDENT_AMBULATORY_CARE_PROVIDER_SITE_OTHER): Admitting: Women's Health

## 2023-12-16 ENCOUNTER — Other Ambulatory Visit (HOSPITAL_COMMUNITY)
Admission: RE | Admit: 2023-12-16 | Discharge: 2023-12-16 | Disposition: A | Discharge status: Home / Self Care | Source: Ambulatory Visit | Attending: Women's Health | Admitting: Women's Health

## 2023-12-16 ENCOUNTER — Encounter: Payer: Self-pay | Admitting: Women's Health

## 2023-12-16 VITALS — BP 125/85 | HR 82 | Ht <= 58 in | Wt 179.0 lb

## 2023-12-16 DIAGNOSIS — Z1331 Encounter for screening for depression: Secondary | ICD-10-CM

## 2023-12-16 DIAGNOSIS — Z113 Encounter for screening for infections with a predominantly sexual mode of transmission: Secondary | ICD-10-CM | POA: Insufficient documentation

## 2023-12-16 DIAGNOSIS — Z1151 Encounter for screening for human papillomavirus (HPV): Secondary | ICD-10-CM | POA: Diagnosis not present

## 2023-12-16 DIAGNOSIS — Z01419 Encounter for gynecological examination (general) (routine) without abnormal findings: Secondary | ICD-10-CM | POA: Diagnosis not present

## 2023-12-16 NOTE — Progress Notes (Signed)
 WELL-WOMAN EXAMINATION Patient name: Theresa Lane MRN [id number]  Date of birth: 2001/02/08 Chief Complaint:   Gynecologic Exam  History of Present Illness:   Theresa Lane is a 23 y.o. G49P1001 Hispanic female being seen today for a routine well-woman exam.  Current complaints: migraines, seeing someone, has improved. Had thought is was depo at one time, stopped and didn't improve, so wasn't that  Does desire STD screen, no sx No LMP recorded. Patient has had an injection. The current method of family planning is Depo-Provera  injections.  Last pap 11/25/22. Results were: NILM w/ HRHPV negative. H/O abnormal pap: no Last mammogram: never. Results were: N/A. Family h/o breast cancer: no Last colonoscopy: never. Results were: N/A. Family h/o colorectal cancer: no     12/16/2023    3:32 PM 06/06/2021    8:52 AM 03/08/2021   10:32 AM  Depression screen PHQ 2/9  Decreased Interest 2 2 1   Down, Depressed, Hopeless 2 1 0  PHQ - 2 Score 4 3 1   Altered sleeping 1 2 2   Tired, decreased energy 2 2 2   Change in appetite 2 1 2   Feeling bad or failure about yourself  1 0 0  Trouble concentrating 0 1 1  Moving slowly or fidgety/restless 0 1 1  Suicidal thoughts 0 0 0  PHQ-9 Score 10 10 9         12/16/2023    3:33 PM 06/06/2021    8:52 AM 03/08/2021   10:33 AM  GAD 7 : Generalized Anxiety Score  Nervous, Anxious, on Edge 1 1 1   Control/stop worrying 1 0 0  Worry too much - different things 1 1 1   Trouble relaxing 1 1 1   Restless 0 0 1  Easily annoyed or irritable 0 1 0  Afraid - awful might happen 0 1 1  Total GAD 7 Score 4 5 5      Review of Systems:   Pertinent items are noted in HPI Denies any headaches, blurred vision, fatigue, shortness of breath, chest pain, abdominal pain, abnormal vaginal discharge/itching/odor/irritation, problems with periods, bowel movements, urination, or intercourse unless otherwise stated above. Pertinent History Reviewed:  Reviewed past  medical,surgical, social and family history.  Reviewed problem list, medications and allergies. Physical Assessment:   Vitals:   12/16/23 1529  BP: 125/85  Pulse: 82  Weight: 179 lb (81.2 kg)  Height: 4' 10 (1.473 m)  Body mass index is 37.41 kg/m.        Physical Examination:   General appearance - well appearing, and in no distress  Mental status - alert, oriented to person, place, and time  Psych:  She has a normal mood and affect  Skin - warm and dry, normal color, no suspicious lesions noted  Chest - effort normal, all lung fields clear to auscultation bilaterally  Heart - normal rate and regular rhythm  Neck:  midline trachea, no thyromegaly or nodules  Breasts - breasts appear normal, no suspicious masses, no skin or nipple changes or  axillary nodes  Abdomen - soft, nontender, nondistended, no masses or organomegaly  Pelvic - VULVA: normal appearing vulva with no masses, tenderness or lesions  VAGINA: normal appearing vagina with normal color and discharge, no lesions  CERVIX: normal appearing cervix without discharge or lesions, no CMT  Thin prep pap is not done   UTERUS: uterus is felt to be normal size, shape, consistency and nontender   ADNEXA: No adnexal masses or tenderness noted.  Extremities:  No swelling or  varicosities noted  Chaperone: Alan Fischer  No results found for this or any previous visit (from the past 24 hours).  Assessment & Plan:  1) Well-Woman Exam  2) STD screen> CV swab  Labs/procedures today: CV swab  Mammogram: @ 22yo, or sooner if problems Colonoscopy: @ 22yo, or sooner if problems  No orders of the defined types were placed in this encounter.   Meds: No orders of the defined types were placed in this encounter.   Follow-up: Return in about 1 year (around 12/15/2024) for Physical; as scheduled for next depo.  Theresa Lane CNM, [facility] 12/16/2023 3:55 PM

## 2023-12-16 NOTE — Patient Instructions (Signed)
 For Headaches:  Stay well hydrated, drink enough water so that your urine is clear, sometimes if you are dehydrated you can get headaches Eat small frequent meals and snacks, sometimes if you are hungry you can get headaches Sometimes you get headaches during pregnancy from the pregnancy hormones You can try tylenol (1-2 regular strength 325mg  or 1-2 extra strength 500mg ) as directed on the box. The least amount of medication that works is best.  Cool compresses (cool wet washcloth or ice pack) to area of head that is hurting You can also try drinking a caffeinated drink to see if this will help If not helping, try below:  For Prevention of Headaches/Migraines: CoQ10 100mg  three times daily Vitamin B2 400mg  daily Magnesium Oxide 400-600mg  daily  Foods to alleviate migraines:  1) dark leafy greens 2) avocado 3) tuna 4) salmon  5) beans and legumes  Foods to avoid: 1) Excessive (or irregular timing) coffee 3) aged cheeses 4) chocolate 5) citrus fruits 6) aspartame and other artifical sweeteners 7) yeast 8) MSG (in processed foods) 9) processed and cured meats 10) nuts and certain seeds 11) chicken livers and other organ meats 12) dairy products like buttermilk, sour cream, and yogurt 13) dried fruits like dates, figs, and raisins 14) garlic 15) onions 16) potato chips 17) pickled foods like olives and sauerkraut 18) some fresh fruits like ripe banana, papaya, red plums, raspberries, kiwi, pineapple 19) tomato-based products  Recommend to keep a migraine diary: rate daily the severity of your headache (1-10) and what foods you eat that day to help determine patterns.   If You Get a Bad Headache/Migraine: Benadryl 25mg   Magnesium Oxide 1 large Gatorade 2 extra strength Tylenol (1,000mg  total) 1 cup coffee or Coke      If this doesn't help please call us @ [phone]

## 2023-12-18 ENCOUNTER — Ambulatory Visit: Payer: Self-pay | Admitting: Women's Health

## 2023-12-18 LAB — CERVICOVAGINAL ANCILLARY ONLY
Bacterial Vaginitis (gardnerella): POSITIVE — AB
Candida Glabrata: NEGATIVE
Candida Vaginitis: NEGATIVE
Chlamydia: NEGATIVE
Comment: NEGATIVE
Comment: NEGATIVE
Comment: NEGATIVE
Comment: NEGATIVE
Comment: NEGATIVE
Comment: NORMAL
Neisseria Gonorrhea: NEGATIVE
Trichomonas: NEGATIVE

## 2023-12-18 MED ORDER — METRONIDAZOLE 500 MG PO TABS: 500.0000 mg | ORAL_TABLET | Freq: Two times a day (BID) | ORAL | 0 refills | Status: AC

## 2024-01-05 ENCOUNTER — Telehealth: Payer: Self-pay | Admitting: Neurology

## 2024-01-05 NOTE — Telephone Encounter (Signed)
 Noted.  If pt calls back an appt with NP can be made to discuss medications for migraines.

## 2024-01-05 NOTE — Telephone Encounter (Signed)
 Pt called stating that PCP  is requesting to Place Pt on medication for weightloss ,However Pt PCP doesn't want Pt  medication to effect Pt the patient  has server Headaches  and some weight loss medication may cause this .  Pt PCP  wants Pt to follow up with Neurologist . PT did not have name of medication PCP want to prescribe at the time of call, However PT will call office  back with name of Weight loss medication

## 2024-01-21 ENCOUNTER — Ambulatory Visit (INDEPENDENT_AMBULATORY_CARE_PROVIDER_SITE_OTHER)

## 2024-01-21 DIAGNOSIS — Z3042 Encounter for surveillance of injectable contraceptive: Secondary | ICD-10-CM | POA: Diagnosis not present

## 2024-01-21 MED ORDER — MEDROXYPROGESTERONE ACETATE 150 MG/ML IM SUSY
150.0000 mg | PREFILLED_SYRINGE | Freq: Once | INTRAMUSCULAR | Status: AC
  Administered 2024-01-21: 150 mg via INTRAMUSCULAR

## 2024-01-21 NOTE — Progress Notes (Signed)
   NURSE VISIT- INJECTION  SUBJECTIVE:  Theresa Lane is a 23 y.o. G74P1001 female here for a Depo Provera  for contraception/period management. She is a GYN patient.   OBJECTIVE:  There were no vitals taken for this visit.  Appears well, in no apparent distress  Injection administered in: Right deltoid  Meds ordered this encounter  Medications   medroxyPROGESTERone  Acetate SUSY 150 mg    ASSESSMENT: GYN patient Depo Provera  for contraception/period management PLAN: Follow-up: in 11-13 weeks for next Depo   Aleck FORBES Blase  01/21/2024 11:05 AM

## 2024-04-14 ENCOUNTER — Ambulatory Visit (INDEPENDENT_AMBULATORY_CARE_PROVIDER_SITE_OTHER): Admitting: *Deleted

## 2024-04-14 DIAGNOSIS — Z3042 Encounter for surveillance of injectable contraceptive: Secondary | ICD-10-CM | POA: Diagnosis not present

## 2024-04-14 MED ORDER — MEDROXYPROGESTERONE ACETATE 150 MG/ML IM SUSY
150.0000 mg | PREFILLED_SYRINGE | Freq: Once | INTRAMUSCULAR | Status: AC
  Administered 2024-04-14: 150 mg via INTRAMUSCULAR

## 2024-04-14 NOTE — Progress Notes (Signed)
" ° °  NURSE VISIT- INJECTION  SUBJECTIVE:  Theresa Lane is a 24 y.o. G87P1001 female here for a Depo Provera  for contraception/period management. She is a GYN patient.   OBJECTIVE:  There were no vitals taken for this visit.  Appears well, in no apparent distress  Injection administered in: Left deltoid  Meds ordered this encounter  Medications   medroxyPROGESTERone  Acetate SUSY 150 mg    ASSESSMENT: GYN patient Depo Provera  for contraception/period management PLAN: Follow-up: in 11-13 weeks for next Depo   Rutherford Rover  04/14/2024 1:59 PM  "

## 2024-07-07 ENCOUNTER — Ambulatory Visit
